# Patient Record
Sex: Male | Born: 1990 | Race: White | Hispanic: No | Marital: Married | Smoking: Never smoker
Health system: Southern US, Community
[De-identification: ages and names within clinical notes are randomized; demographics above are authoritative.]

## PROBLEM LIST (undated history)

## (undated) DIAGNOSIS — K219 Gastro-esophageal reflux disease without esophagitis: Secondary | ICD-10-CM

## (undated) DIAGNOSIS — E669 Obesity, unspecified: Secondary | ICD-10-CM

## (undated) DIAGNOSIS — R1013 Epigastric pain: Secondary | ICD-10-CM

## (undated) DIAGNOSIS — H9190 Unspecified hearing loss, unspecified ear: Secondary | ICD-10-CM

## (undated) DIAGNOSIS — I10 Essential (primary) hypertension: Secondary | ICD-10-CM

## (undated) DIAGNOSIS — F32A Depression, unspecified: Secondary | ICD-10-CM

## (undated) DIAGNOSIS — E049 Nontoxic goiter, unspecified: Secondary | ICD-10-CM

## (undated) DIAGNOSIS — G902 Horner's syndrome: Secondary | ICD-10-CM

## (undated) DIAGNOSIS — Q213 Tetralogy of Fallot: Secondary | ICD-10-CM

## (undated) DIAGNOSIS — Z9109 Other allergy status, other than to drugs and biological substances: Secondary | ICD-10-CM

## (undated) DIAGNOSIS — F419 Anxiety disorder, unspecified: Secondary | ICD-10-CM

## (undated) DIAGNOSIS — Z8774 Personal history of (corrected) congenital malformations of heart and circulatory system: Secondary | ICD-10-CM

## (undated) DIAGNOSIS — E063 Autoimmune thyroiditis: Secondary | ICD-10-CM

## (undated) DIAGNOSIS — J45909 Unspecified asthma, uncomplicated: Secondary | ICD-10-CM

## (undated) DIAGNOSIS — E7849 Other hyperlipidemia: Secondary | ICD-10-CM

## (undated) HISTORY — DX: Obesity, unspecified: E66.9

## (undated) HISTORY — DX: Essential (primary) hypertension: I10

## (undated) HISTORY — PX: CARDIAC SURGERY: SHX584

## (undated) HISTORY — DX: Epigastric pain: R10.13

## (undated) HISTORY — DX: Unspecified hearing loss, unspecified ear: H91.90

## (undated) HISTORY — PX: KNEE SURGERY: SHX244

## (undated) HISTORY — PX: LUMBAR SPINE SURGERY: SHX701

## (undated) HISTORY — DX: Unspecified asthma, uncomplicated: J45.909

## (undated) HISTORY — DX: Nontoxic goiter, unspecified: E04.9

## (undated) HISTORY — DX: Other allergy status, other than to drugs and biological substances: Z91.09

## (undated) HISTORY — PX: OTHER SURGICAL HISTORY: SHX169

## (undated) HISTORY — DX: Autoimmune thyroiditis: E06.3

## (undated) HISTORY — DX: Other hyperlipidemia: E78.49

## (undated) HISTORY — PX: EXTERNAL EAR SURGERY: SHX627

## (undated) HISTORY — DX: Horner's syndrome: G90.2

## (undated) HISTORY — DX: Personal history of (corrected) congenital malformations of heart and circulatory system: Z87.74

## (undated) HISTORY — DX: Gastro-esophageal reflux disease without esophagitis: K21.9

---
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
## 2010-04-20 ENCOUNTER — Encounter: Payer: Self-pay | Admitting: Specialist

## 2010-08-23 ENCOUNTER — Encounter: Payer: Self-pay | Admitting: *Deleted

## 2010-08-23 DIAGNOSIS — E038 Other specified hypothyroidism: Secondary | ICD-10-CM

## 2010-08-23 DIAGNOSIS — I10 Essential (primary) hypertension: Secondary | ICD-10-CM | POA: Insufficient documentation

## 2010-09-13 ENCOUNTER — Other Ambulatory Visit: Payer: Self-pay | Admitting: "Endocrinology

## 2010-09-30 ENCOUNTER — Other Ambulatory Visit: Payer: Self-pay | Admitting: "Endocrinology

## 2010-10-01 ENCOUNTER — Other Ambulatory Visit: Payer: Self-pay | Admitting: *Deleted

## 2010-12-25 ENCOUNTER — Ambulatory Visit: Payer: Self-pay | Admitting: "Endocrinology

## 2011-01-01 ENCOUNTER — Other Ambulatory Visit: Payer: Self-pay | Admitting: "Endocrinology

## 2011-02-11 ENCOUNTER — Ambulatory Visit (INDEPENDENT_AMBULATORY_CARE_PROVIDER_SITE_OTHER): Payer: Self-pay | Admitting: "Endocrinology

## 2011-02-11 ENCOUNTER — Encounter: Payer: Self-pay | Admitting: "Endocrinology

## 2011-02-11 VITALS — BP 154/80 | HR 89 | Wt 191.8 lb

## 2011-02-11 DIAGNOSIS — E669 Obesity, unspecified: Secondary | ICD-10-CM

## 2011-02-11 DIAGNOSIS — E038 Other specified hypothyroidism: Secondary | ICD-10-CM

## 2011-02-11 DIAGNOSIS — E063 Autoimmune thyroiditis: Secondary | ICD-10-CM

## 2011-02-11 DIAGNOSIS — E049 Nontoxic goiter, unspecified: Secondary | ICD-10-CM

## 2011-02-11 DIAGNOSIS — K219 Gastro-esophageal reflux disease without esophagitis: Secondary | ICD-10-CM

## 2011-02-11 DIAGNOSIS — I10 Essential (primary) hypertension: Secondary | ICD-10-CM

## 2011-02-11 DIAGNOSIS — R1013 Epigastric pain: Secondary | ICD-10-CM

## 2011-02-11 LAB — GLUCOSE, POCT (MANUAL RESULT ENTRY): POC Glucose: 97

## 2011-02-11 MED ORDER — LISINOPRIL 10 MG PO TABS: ORAL_TABLET | ORAL | Status: DC

## 2011-02-11 NOTE — Patient Instructions (Signed)
Followup visit with me in 6 months. Please take lisinopril, 10 mg, one pill at breakfast and one pill at supper. Please check her blood pressure checked with Dr. Janelle Floor in about 2 weeks. Please give me a call after that so I know if we need to adjust her blood pressure pills further. Please repeat thyroid tests done about 2 weeks prior to next visit.

## 2011-04-01 ENCOUNTER — Other Ambulatory Visit: Payer: Self-pay | Admitting: "Endocrinology

## 2011-05-19 ENCOUNTER — Encounter: Payer: Self-pay | Admitting: "Endocrinology

## 2011-05-19 DIAGNOSIS — E669 Obesity, unspecified: Secondary | ICD-10-CM | POA: Insufficient documentation

## 2011-05-19 DIAGNOSIS — J45909 Unspecified asthma, uncomplicated: Secondary | ICD-10-CM | POA: Insufficient documentation

## 2011-05-19 DIAGNOSIS — Z8774 Personal history of (corrected) congenital malformations of heart and circulatory system: Secondary | ICD-10-CM | POA: Insufficient documentation

## 2011-05-19 DIAGNOSIS — G902 Horner's syndrome: Secondary | ICD-10-CM | POA: Insufficient documentation

## 2011-05-19 DIAGNOSIS — Z9109 Other allergy status, other than to drugs and biological substances: Secondary | ICD-10-CM | POA: Insufficient documentation

## 2011-05-19 DIAGNOSIS — E7849 Other hyperlipidemia: Secondary | ICD-10-CM | POA: Insufficient documentation

## 2011-05-19 DIAGNOSIS — R1013 Epigastric pain: Secondary | ICD-10-CM | POA: Insufficient documentation

## 2011-05-19 DIAGNOSIS — E049 Nontoxic goiter, unspecified: Secondary | ICD-10-CM | POA: Insufficient documentation

## 2011-05-19 DIAGNOSIS — K219 Gastro-esophageal reflux disease without esophagitis: Secondary | ICD-10-CM | POA: Insufficient documentation

## 2011-05-19 DIAGNOSIS — I10 Essential (primary) hypertension: Secondary | ICD-10-CM | POA: Insufficient documentation

## 2011-05-19 DIAGNOSIS — E063 Autoimmune thyroiditis: Secondary | ICD-10-CM | POA: Insufficient documentation

## 2011-05-19 DIAGNOSIS — H9190 Unspecified hearing loss, unspecified ear: Secondary | ICD-10-CM | POA: Insufficient documentation

## 2011-05-19 NOTE — Progress Notes (Addendum)
Subjective:  Patient Name: Albert Watson Date of Birth: [birth year]  MRN: [id number]  Albert Watson  presents to the office today for follow-up of his [place], and hyperlipidemia, goiter, status post surgery for complex congenital heart disease, Hashimoto's disease, dyspepsia, acquired hypothyroidism, GERD, hypertension, and obesity.  HISTORY OF PRESENT ILLNESS:   Albert Watson is a 21 y.o. Caucasian young man.  Albert Watson was unaccompanied.   1. The patient was first referred to me on 11/13/2005 by his primary care provider, Dr. 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 The patient was almost 21 years old.   A. The patient had been born with Tetralogy of Fallot. He had had 2 surgical procedures to correct his complex congenital heart disease. As part of his pediatric cardiology followup, Dr. Rosiland Oz, [facility] pediatric cardiologist ordered laboratory tests which reportedly showed a higher level of triglycerides than desirable. His past medical history was positive for asthma and allergies to environmental allergens. 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 He had a previous fracture of his left knee. During his second cardiac surgery hr had a collapse of his lung. 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 He also had ear surgery. He was allergic to sulfa, Septra, Augmentin, azithromycin, and codeine. Family history was positive for type 2 diabetes in the biologic mother and maternal grandmother. Mother had increased cholesterol and increased triglycerides. She was also obese. AThere was a strong family history of heart disease. Paternal grandfather had coronary artery bypass graft and several MIs. Paternal grandmother had several coronary artery bypass grafts. Paternal uncle had MIs and coronary artery bypass graft. Paternal grandfather died of an MI. Father had high cholesterol.  B. On physical examination his height was  168.4 cm, roughly 66 inches. His weight was 150 pounds. His blood pressure was 131/70. His hemoglobin A1c was 5.1%. He was an alert and bright young man. He had a 25-30 g thyroid gland. Thyroid gland was diffusely enlarged. He had crescendo-decrescendo heart murmur. The remainder of his examination was unremarkable. Panel from 09/01/05 showed a cholesterol of 152, triglycerides 233, HDL of 29, and LDL of 77. TSH was 2.066. We discussed our Eat Right Diet plan. I referred him to Ms. Hosie Spangle, RD, CDE, a registered dietitian at [facility] in [place]. I asked him to exercise 30-40 minutes every day.  2. During past 5 years, we have dealt with several issues:  A. Familial combined hyperlipidemia: The patient has never been on any lipid medications. Total cholesterol values have varied between 114-164. Triglyceride values have varied between 149-491. HDL values have varied between 21-41. LDL values have varied between 57-94 Most of his higher triglyceride values have occurred when he was hypothyroid.   B. 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 Laboratory tests done on 03/05/06 showed a TSH of 1.335, a T4 of 10.7, and a T3 of 197.2. The TPO antibody was borderline elevated at 37.2. Thyroid tests and 10/26/06, however, showed a TSH of 13.275, free T4 0.96, and free T3 at 3.1. We waited another 2 months and repeated the tests. On 12/04/06 the TSH was 15.443, free T4 was 0.90, and the T3 was 252.1. I started him on Synthroid, 75 mcg per day. During the past 2 years we have gradually increased the Synthroid dose to 125 mcg 6 days per week and 62.5 mcg (half of a 125 mcg tablet) on Sundays.  C. Obesity: He has gained weight,  lost weight, and  gained weight again. His highest weight prior to today was 196.2 pounds at his last visit on 08/06/09.  D. Dyspepsia/GERD: Part of the patient's problem in gaining weight has been dyspepsia, which presents as hunger pains. He also has reflux. When the patient  first came to see me in 2007 he was already on Nexium. In 2008 we converted him to prilosec, 40 mg/day.   E. Hypertension: He's been taking lisinopril since January 2011. 3. The patient's last [facility] visit was on 08/06/09. In the interim, his blood pressure has often been elevated. There have been a lot of stress in the family. His uncle died 3 months ago in a motor vehicle accident. His mother is in liver failure due to her cirrhosis. She's been in the hospital. She is hoping to get a liver transplant. 4. Pertinent Review of Systems:  Constitutional: The patient feels "pretty good "overall. Eyes: Vision is good. There are no significant eye complaints. Neck: He does have an occasional "knot" of his anterior neck in the area of his thyroid isthmus. The patient has no complaints of anterior neck soreness, tenderness,  pressure, discomfort, or difficulty swallowing.  Heart: Heart rate increases with exercise or other physical activity. The patient has no complaints of palpitations, irregular heat beats, chest pain, or chest pressure. Gastrointestinal: He still has some excess hunger and some reflux at times. Bowel movents seem normal. The patient has no complaints of upset stomach, stomach aches or pains, diarrhea, or constipation. Legs: Muscle mass and strength seem normal. He still has problems with pain in his left knee. There are no complaints of numbness, tingling, burning, or other pain. No edema is noted. Feet: There are no obvious foot problems. There are no complaints of numbness, tingling, burning, or pain. No edema is noted.   PAST MEDICAL, FAMILY, AND SOCIAL HISTORY:  Past Medical History  Diagnosis Date  . Status post surgery for complex congenital heart disease   . Familial combined hyperlipidemia   . Goiter   . Thyroiditis, autoimmune   . Dyspepsia   . Hypothyroidism, acquired, autoimmune   . GERD (gastroesophageal reflux disease)   . Hypertension   . Asthma, chronic   .  Environmental allergies   . Horner's syndrome   . Hard of hearing   . Obesity (BMI 30-39.9)     Family History  Problem Relation Age of Onset  . Hyperlipidemia Mother     Increased cholesterol and triglycerides  . Diabetes Mother     T2 DM  . Obesity Mother   . Thyroid disease Father   . Heart disease Maternal Uncle     MI and CABG  . Heart disease Maternal Grandmother     CABG  . Heart disease Maternal Grandfather     CABG and MIs  . Cancer Maternal Grandfather     Skin  . Heart disease Paternal Grandfather     Died of MI    Current outpatient prescriptions:docusate sodium (COLACE) 100 MG capsule, Take 100 mg by mouth 2 (two) times daily.  , Disp: , Rfl: ;  lisinopril (PRINIVIL,ZESTRIL) 10 MG tablet, One 10 mg tablet at breakfast and one 10 mg tablet at supper., Disp: 60 tablet, Rfl: 6;  omeprazole (PRILOSEC) 40 MG capsule, Take 40 mg by mouth daily.  , Disp: , Rfl:  levothyroxine (SYNTHROID, LEVOTHROID) 125 MCG tablet, TAKE ONE TABLET BY MOUTH 6 DAYS A WEEK (MON-SAT) AND ONE-HALF TABLET BY MOUTH ON SUN, Disp: 30 tablet, Rfl: 4  Allergies as  of 02/11/2011 - Review Complete 02/11/2011  Allergen Reaction Noted  . Augmentin  08/23/2010  . Codeine  08/23/2010  . Septra (bactrim)  08/23/2010    1. Work and Family: He still attends [employer]. 2. Activities: He is not exercising at all. 3. Smoking, alcohol, or drugs: No tobacco or drugs. 4. Primary Care Provider: Dr. Joette Catching  ROS: There are no other significant problems involving Cole's other body systems.   Objective:  Vital Signs:  BP 154/80  Pulse 89  Wt 191 lb 12.8 oz (87 kg)   Ht Readings from Last 3 Encounters:  No data found for Ht   His height is 67 inches. Wt Readings from Last 3 Encounters:  02/11/11 191 lb 12.8 oz (87 kg)   His BMI is 35 to  PHYSICAL EXAM:  Constitutional: The patient appears healthy and well nourished.  Face: The face appears normal.  Eyes: There is no  obvious arcus or proptosis. Moisture appears normal. Mouth: The oropharynx and tongue appear normal. Oral moisture is normal. Neck: The neck appears to be visibly normal. No carotid bruits are noted. The thyroid gland is 25-30 g in size. Thyroid gland is relatively firm. The thyroid gland is not tender to palpation. Lungs: The lungs are clear to auscultation. Air movement is good. Heart: Heart rate and rhythm are regular. Heart sounds S1 and S2 are normal. I did not appreciate any pathologic cardiac murmurs. Abdomen: The abdomen is somewhat enlarged. Bowel sounds are normal. There is no obvious hepatomegaly, splenomegaly, or other mass effect.  Arms: Muscle size and bulk are normal for age. Hands: There is no obvious tremor. Phalangeal and metacarpophalangeal joints are normal. Palmar muscles are normal. Palmar skin is normal. Palmar moisture is also normal. Legs: Muscles appear normal for age. No edema is present. Neurologic: Strength is normal for age in both the upper and lower extremities. Muscle tone is normal. Sensation to touch is normal in both legs.    LAB DATA: 02/10/11: TSH was 1.148. Free T4 was 1.48. Free T3 was 4.2.   Assessment and Plan:   ASSESSMENT:  1. Hypothyroidism: The patient was euthyroid on his current doses of thyroid hormone. I suspect that he has more thyroid cells to lose and that we will probably need to increase his Synthroid dose over the next 5 years. 2. Hashimoto's disease: His thyroiditis is clinically quiescent. 3. Hypertension: His blood pressure is clearly worse. He needs more lisinopril. He also needs to take his medicines every day. 4. Obesity: His weight today of 191 pounds is his maximum weight ever. The increase in weight is part of the reason his blood pressure has increased. Obesity exacerbates many other problems, including hyperlipidemia. If non-alcoholic fatty liver disease was a factor in causing his mother's cirrhosis and liver failure, then it is  especially important for him to lose fat weight. 5. Goiter: His thyroid gland is smaller than it was in January 2011. 6. Dyspepsia/GERD: His symptoms are better on omeprazole.  PLAN:  1. Diagnostic: TFTs, CMP, and lipid panel prior to next visit. 2. Therapeutic: Increase lisinoprol to 10 mg, twice daily. Follow-up with Dr. Lysbeth Galas. 3. Patient education: We discussed how Hashimoto's disease has gradually but progressively knocked out his thyroid cells. We also discussed the possibility that he could develop either type 2 diabetes that runs in his family or type 1 diabetes, another autoimmune disease 4. Follow-up: Return in about 6 months (around 08/11/2011).  Level of Service: This visit lasted  in excess of 40 minutes. More than 50% of the visit was devoted to counseling.   David Stall, MD 05/19/2011 6:04 PM

## 2011-08-11 ENCOUNTER — Ambulatory Visit: Payer: Self-pay | Admitting: "Endocrinology

## 2011-08-28 ENCOUNTER — Ambulatory Visit: Payer: Self-pay | Admitting: Pediatrics

## 2011-11-05 ENCOUNTER — Ambulatory Visit: Payer: Self-pay | Admitting: "Endocrinology

## 2012-01-26 ENCOUNTER — Other Ambulatory Visit: Payer: Self-pay | Admitting: "Endocrinology

## 2012-02-05 ENCOUNTER — Other Ambulatory Visit: Payer: Self-pay | Admitting: "Endocrinology

## 2012-02-05 NOTE — Telephone Encounter (Signed)
1. The patient left a VM message that he is running our of his BP medication(lisinopril). His pharmacy told him that they had requested refill authorization from [place], but we had not responded. He asked me to call him at [phone].  2. I attempted to return his call, but he was not available. His last clinic visit with me was on 02/11/11. At that time he was taking 10 mg of lisinopril, twice daily. Since he has not returned to clinic in almost one year, I can only give him a three-month refill authorization. I asked him to call me and give me the telephone number of the pharmacy to which he wants me to call the refill. If he calls and requests a FU appointment within the next three months and then keeps the appointment, then I can continue to prescribe provide endocrine care to him and medication for him. If not, however, then he will be discharged from the practice.  Albert Watson

## 2012-02-06 ENCOUNTER — Telehealth: Payer: Self-pay | Admitting: "Endocrinology

## 2012-02-06 NOTE — Telephone Encounter (Signed)
1. The patient called earlier and left a VM msg requesting refills of his lisinopril, 10 mg tabs, one tab, twice daily. 2. I returned his call and notified him that I had called the scrip in to his [facility] pharmacy in [place], [phone].  3. He has made a FU appointment with me on 05/25/11. He requested that a lab order request be faxed to Dr. Joyce Copa office so that he ncan have his labs drawn there prior to his appointment. I told him that he needs a CMP, TFTs, and fasting lipid panel. Albert Watson

## 2012-02-06 NOTE — Addendum Note (Signed)
Addended by: David Stall on: 02/06/2012 04:48 PM   Modules accepted: Orders

## 2012-04-26 ENCOUNTER — Other Ambulatory Visit: Payer: Self-pay | Admitting: *Deleted

## 2012-04-26 DIAGNOSIS — E038 Other specified hypothyroidism: Secondary | ICD-10-CM

## 2012-05-21 LAB — LIPID PANEL
Cholesterol: 173 mg/dL (ref 0–200)
HDL: 35 mg/dL — ABNORMAL LOW (ref >39)
Total CHOL/HDL Ratio: 4.9 Ratio
Triglycerides: 283 mg/dL — ABNORMAL HIGH (ref <150)

## 2012-05-21 LAB — TSH: TSH: 0.321 u[IU]/mL — ABNORMAL LOW (ref 0.350–4.500)

## 2012-05-21 LAB — COMPREHENSIVE METABOLIC PANEL
AST: 21 U/L (ref 0–37)
Alkaline Phosphatase: 74 U/L (ref 39–117)
BUN: 13 mg/dL (ref 6–23)
Creat: 0.85 mg/dL (ref 0.50–1.35)
Potassium: 4.4 mEq/L (ref 3.5–5.3)
Total Bilirubin: 0.5 mg/dL (ref 0.3–1.2)

## 2012-05-24 ENCOUNTER — Ambulatory Visit (INDEPENDENT_AMBULATORY_CARE_PROVIDER_SITE_OTHER): Payer: Self-pay | Admitting: "Endocrinology

## 2012-05-24 ENCOUNTER — Encounter: Payer: Self-pay | Admitting: "Endocrinology

## 2012-05-24 VITALS — BP 153/88 | HR 105 | Wt 193.6 lb

## 2012-05-24 DIAGNOSIS — R1013 Epigastric pain: Secondary | ICD-10-CM

## 2012-05-24 DIAGNOSIS — E669 Obesity, unspecified: Secondary | ICD-10-CM

## 2012-05-24 LAB — POCT GLYCOSYLATED HEMOGLOBIN (HGB A1C): Hemoglobin A1C: 5

## 2012-05-24 NOTE — Patient Instructions (Signed)
Follow up visit in 6 months. 

## 2012-05-24 NOTE — Progress Notes (Signed)
Subjective:  Patient Name: Albert Watson Date of Birth: 1990/07/21  MRN: [id number]  Albert Watson  presents to the office today for follow-up of his familial combined hyperlipidemia, goiter, status post surgery for complex congenital heart disease, Hashimoto's disease, dyspepsia, acquired autoimmune hypothyroidism, GERD, hypertension, and obesity.  HISTORY OF PRESENT ILLNESS:   Albert Watson is a 22 y.o. Caucasian young man.  Albert Watson was accompanied by his father.   1. The patient was first referred to me on 11/13/2005 by his primary care provider, Dr. Joette Catching, from [place], [place], for evaluation and management of familial combined hyperlipidemia. The patient was almost 22 years old.   A. The patient had been born with Tetralogy of Fallot. He had had 2 surgical procedures to correct his complex congenital heart disease. As part of his pediatric cardiology followup, Dr. Rosiland Oz, [facility] pediatric cardiologist ordered laboratory tests which reportedly showed a higher level of triglycerides than desirable. His past medical history was positive for asthma and allergies to environmental allergens. 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 He had a previous fracture of his left knee. During his second cardiac surgery he had a collapse of his lung. 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 He also had ear surgery. He was allergic to sulfa, Septra, Augmentin, azithromycin, and codeine. Family history was positive for type 2 diabetes in the biologic mother and maternal grandmother. Mother had increased cholesterol and increased triglycerides. She was also obese. There was a strong family history of heart disease. Paternal grandfather had coronary artery bypass graft and several MIs. Paternal grandmother had several coronary artery bypass grafts. Paternal uncle had MIs and coronary artery bypass graft. Paternal grandfather died of an MI. Father had high cholesterol.  B. On physical  examination his height was 168.4 cm, roughly 66 inches. His weight was 150 pounds. His blood pressure was 131/70. His hemoglobin A1c was 5.1%. He was an alert and bright young man. He had a 25-30 g thyroid gland. Thyroid gland was diffusely enlarged. He had crescendo-decrescendo heart murmur. The remainder of his examination was unremarkable. Panel from 09/01/05 showed a cholesterol of 152, triglycerides 233, HDL of 29, and LDL of 77. TSH was 2.066. We discussed our Eat Right Diet plan. I referred him to Ms. Hosie Spangle, RD, CDE, a registered dietitian at [facility] in [place]. I asked him to exercise 30-40 minutes every day.  2. During the past 6 years, we have dealt with several issues:  A. Familial combined hyperlipidemia: The patient has never been on any lipid medications. Total cholesterol values have varied between 114-164. Triglyceride values have varied between 149-491. HDL values have varied between 21-41. LDL values have varied between 57-94 Most of his higher triglyceride values have occurred when he was hypothyroid.   B. 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 Laboratory tests done on 03/05/06 showed a TSH of 1.335, a T4 of 10.7, and a T3 of 197.2. The TPO antibody was borderline elevated at 37.2. Thyroid tests on 10/26/06, however, showed a TSH of 13.275, free T4 0.96, and free T3 at 3.1. We waited another 2 months and repeated the tests. On 12/04/06 the TSH was 15.443, free T4 was 0.90, and the T3 was 252.1. I started him on Synthroid, 75 mcg per day. During the past 2 years we have gradually increased the Synthroid dose to 125 mcg 6 days per week and 62.5 mcg (half of a 125 mcg tablet) on Sundays.  C. Obesity: He has gained  weight, lost weight, and gained weight again. His highest weight was 196.2 pounds at his visit on 08/11/10.  D. Dyspepsia/GERD: Part of the patient's problem in gaining weight has been dyspepsia, which presents as hunger pains. He also has reflux. When the  patient first came to see me in 2007 he was already on Nexium. In 2008 we converted him to prilosec, 40 mg/day.   E. Hypertension: He's been taking lisinopril since January 2011. 3. The patient's last [facility] visit was on 02/11/11. In the interim, his mother passed away from NASH. Her doctor reportedly told the family the NASH was due to her obesity, but may have been aggravated by Lipitor. Dad believes the NASH was due to Lipitor. Although dad understands that she had abnormal LFTs before beginning Lipitor, he still believes that it was the Lipitor that killed his wife. Dad has severe, bilateral hip arthritis. Albert Watson takes Synthroid, 125 mcg 6 days per week and 1/2 tab on Sundays. He takes lisinopril, 10 mg each AM and 5 mg each PM. He also takes omeprazole, 40 mg each AM. He often takes in a lot of caffeine.  4. Pertinent Review of Systems:  Constitutional: The patient feels "good "overall. Eyes: Vision is good. There are no significant eye complaints. Neck: The patient has no complaints of anterior neck soreness, tenderness,  pressure, discomfort, or difficulty swallowing.  Heart: He occasionally has a brief palpitation of his heart. Heart rate increases with exercise or other physical activity. The patient has no complaints of chest pain or chest pressure. Gastrointestinal: He has occasional excess hunger and reflux at times. Bowel movents seem normal. The patient has no complaints of upset stomach, stomach aches or pains, diarrhea, or constipation. Legs: Muscle mass and strength seem normal. He still has problems with pain in his left knee. There are no complaints of numbness, tingling, burning, or other pain. No edema is noted. Feet: There are no obvious foot problems. There are no complaints of numbness, tingling, burning, or pain. No edema is noted.   PAST MEDICAL, FAMILY, AND SOCIAL HISTORY:  Past Medical History  Diagnosis Date  . Status post surgery for complex congenital heart disease   .  Familial combined hyperlipidemia   . Goiter   . Thyroiditis, autoimmune   . Dyspepsia   . Hypothyroidism, acquired, autoimmune   . GERD (gastroesophageal reflux disease)   . Hypertension   . Asthma, chronic   . Environmental allergies   . Horner's syndrome   . Hard of hearing   . Obesity (BMI 30-39.9)     Family History  Problem Relation Age of Onset  . Hyperlipidemia Mother     Increased cholesterol and triglycerides  . Diabetes Mother     T2 DM  . Obesity Mother   . Thyroid disease Father   . Hyperlipidemia Father     High cholesterol  . Heart disease Maternal Uncle     MI and CABG  . Heart disease Maternal Grandmother     CABG  . Hypertension Maternal Grandmother     T2 DM  . Heart disease Maternal Grandfather     CABG and MIs  . Cancer Maternal Grandfather     Skin  . Heart disease Paternal Grandfather     Died of MI    Current outpatient prescriptions:docusate sodium (COLACE) 100 MG capsule, Take 100 mg by mouth 2 (two) times daily.  , Disp: , Rfl: ;  levothyroxine (SYNTHROID, LEVOTHROID) 125 MCG tablet, TAKE ONE TABLET BY MOUTH  6 DAYS A WEEK (MON-SAT) AND ONE-HALF TABLET BY MOUTH ON SUN, Disp: 30 tablet, Rfl: 4;  lisinopril (PRINIVIL,ZESTRIL) 10 MG tablet, One 10 mg tablet at breakfast and one 10 mg tablet at supper., Disp: 60 tablet, Rfl: 6 omeprazole (PRILOSEC) 40 MG capsule, Take 40 mg by mouth daily.  , Disp: , Rfl:   Allergies as of 05/24/2012 - Review Complete 05/24/2012  Allergen Reaction Noted  . Amoxicillin-pot clavulanate  08/23/2010  . Azithromycin  05/19/2011  . Codeine  08/23/2010  . Septra (bactrim)  08/23/2010  . Sulfa antibiotics  05/19/2011    1. Work and Family: He dropped out of [employer] to take care of his mom and earn some money to pay bills. He works at [employer]. He is building more muscle. He plans to re-enter college in th future.  2. Activities: He is not exercising much at all. 3. Smoking, alcohol, or drugs: No  tobacco or drugs. 4. Primary Care Provider: Dr. Joette Catching  REVIEW OF SYSTEMS: There are no other significant problems involving Cole's other body systems.   Objective:  Vital Signs:  BP 153/88  Pulse 105  Wt 193 lb 9.6 oz (87.816 kg) He missed his meds today.   Ht Readings from Last 3 Encounters:  No data found for Ht   His height is 67 inches. Wt Readings from Last 3 Encounters:  05/24/12 193 lb 9.6 oz (87.816 kg)  02/11/11 191 lb 12.8 oz (87 kg)    PHYSICAL EXAM:  Constitutional: The patient appears healthy, but obese.  Face: The face appears normal.  Eyes: There is no obvious arcus or proptosis. Moisture appears normal. Mouth: The oropharynx and tongue appear normal. Oral moisture is normal. Neck: The neck appears to be visibly normal. No carotid bruits are noted. The thyroid gland is 25-30 g in size. Thyroid gland is relatively firm. The thyroid gland is not tender to palpation. Lungs: The lungs are clear to auscultation. Air movement is good. Heart: Heart rate and rhythm are regular. Heart sounds S1 and S2 are normal. I did not appreciate any pathologic cardiac murmurs. Abdomen: The abdomen is somewhat enlarged. Bowel sounds are normal. There is no obvious hepatomegaly, splenomegaly, or other mass effect.  Arms: Muscle size and bulk are normal for age. Hands: There is no obvious tremor. Phalangeal and metacarpophalangeal joints are normal. Palmar muscles are normal. Palmar skin is normal. Palmar moisture is also normal. Legs: Muscles appear normal for age. No edema is present. Neurologic: Strength is normal for age in both the upper and lower extremities. Muscle tone is normal. Sensation to touch is normal in both legs.    LAB DATA: Hemoglobin A1c is 5.0% today.  02/06/12 (actually drawn on 05/21/12?): CMP normal; TSH 0.321, free T4 1.86, free T3 4.3; cholesterol 173, triglycerides 283, HDL 35, LDL 81; HbA1c 5.0 % 02/10/11: TSH was 1.148. Free T4 was 1.48. Free T3 was  4.2.   Assessment and Plan:   ASSESSMENT:  1. Hypothyroidism: The patient was euthyroid in November 2012. If these TFTs were really drawn last Friday, 05/21/12, then the patient is mildly hyperthyroid now. We have been having a problem with [facility] for months in that they can't seem to correctly record and report the date the labs were ordered, the date the labs were drawn, and the date the labs were resulted.  2. Hashimoto's disease: His thyroiditis is clinically quiescent. 3. Hypertension: His blood pressure is worse. He needs to take his medicines every  day. He needs a BP check in a week. If the BPs are still high, he needs an increase in lisinopril. At last visit I had increased his lisinopril to 10 mg, twice daily. But when he saw Dr. Ace Gins in the interim and complained of feeling sluggish, Dr. Ace Gins reduced the dose to 10 mg in the AM and 5 mg in the PM. 4. Obesity: His weight today of 193 pounds is his maximum weight ever. The increase in weight is part of the reason his blood pressure has increased. Obesity exacerbates many other problems, including hyperlipidemia. 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 5. Goiter: His thyroid gland is essentially the same size as at last visit.  6. Dyspepsia/GERD: His symptoms are better on omeprazole. 7. Familial combined hyperlipidemia: His recent triglyceride level is high. His cholesterol and LDL are fine. His HDL is low. Since he does not want to take any statins, he needs to do an hour of exercise a day or more. He also needs to substantially reduce the amount of carbs and fats in his diet.    PLAN:  1. Diagnostic: TFTs, CMP, and lipid panel prior to next visit. 2. Therapeutic: Continue lisinopril at 10 mg and 5 mg. Follow-up with Dr. Lysbeth Galas for BP. Eat Right Diet or [facility] Diet. Walk an hour or more per day.  3.  Patient education: We discussed how Hashimoto's disease has gradually but progressively knocked out his thyroid cells. We also discussed the possibility that he could develop either type 2 diabetes that runs in his family or type 1 diabetes, another autoimmune disease. We also discussed the possibility of him developing NAFLD later in life if he doesn't lose weight.  4. Follow-up: 6 months  Level of Service: This visit lasted in excess of 40 minutes. More than 50% of the visit was devoted to counseling.   David Stall, MD 05/24/2012 1:42 PM

## 2012-07-02 ENCOUNTER — Ambulatory Visit (HOSPITAL_COMMUNITY)
Admission: RE | Admit: 2012-07-02 | Discharge: 2012-07-02 | Disposition: A | Discharge status: Home / Self Care | Payer: Worker's Compensation | Source: Ambulatory Visit | Attending: Family Medicine | Admitting: Family Medicine

## 2012-07-02 ENCOUNTER — Other Ambulatory Visit (HOSPITAL_COMMUNITY): Payer: Self-pay | Admitting: Family Medicine

## 2012-07-02 DIAGNOSIS — M25562 Pain in left knee: Secondary | ICD-10-CM

## 2012-07-02 DIAGNOSIS — M25569 Pain in unspecified knee: Secondary | ICD-10-CM | POA: Insufficient documentation

## 2012-07-02 DIAGNOSIS — T1490XA Injury, unspecified, initial encounter: Secondary | ICD-10-CM

## 2012-07-02 DIAGNOSIS — S99929A Unspecified injury of unspecified foot, initial encounter: Secondary | ICD-10-CM | POA: Insufficient documentation

## 2012-07-02 DIAGNOSIS — X58XXXA Exposure to other specified factors, initial encounter: Secondary | ICD-10-CM | POA: Insufficient documentation

## 2012-07-02 DIAGNOSIS — S8990XA Unspecified injury of unspecified lower leg, initial encounter: Secondary | ICD-10-CM | POA: Insufficient documentation

## 2012-08-24 ENCOUNTER — Ambulatory Visit: Payer: Worker's Compensation | Attending: Specialist | Admitting: Physical Therapy

## 2012-08-24 DIAGNOSIS — R5381 Other malaise: Secondary | ICD-10-CM | POA: Insufficient documentation

## 2012-08-24 DIAGNOSIS — M25569 Pain in unspecified knee: Secondary | ICD-10-CM | POA: Insufficient documentation

## 2012-08-24 DIAGNOSIS — IMO0001 Reserved for inherently not codable concepts without codable children: Secondary | ICD-10-CM | POA: Insufficient documentation

## 2012-08-27 ENCOUNTER — Encounter: Payer: Self-pay | Admitting: Physical Therapy

## 2012-08-30 ENCOUNTER — Ambulatory Visit: Payer: Worker's Compensation | Attending: Specialist | Admitting: Physical Therapy

## 2012-08-30 DIAGNOSIS — IMO0001 Reserved for inherently not codable concepts without codable children: Secondary | ICD-10-CM | POA: Insufficient documentation

## 2012-08-30 DIAGNOSIS — M25569 Pain in unspecified knee: Secondary | ICD-10-CM | POA: Insufficient documentation

## 2012-08-30 DIAGNOSIS — R5381 Other malaise: Secondary | ICD-10-CM | POA: Insufficient documentation

## 2012-09-03 ENCOUNTER — Ambulatory Visit: Payer: Worker's Compensation | Admitting: Physical Therapy

## 2012-09-06 ENCOUNTER — Encounter: Payer: Self-pay | Admitting: Physical Therapy

## 2012-09-08 ENCOUNTER — Ambulatory Visit: Payer: Worker's Compensation | Admitting: Physical Therapy

## 2012-09-13 ENCOUNTER — Ambulatory Visit: Payer: Worker's Compensation | Admitting: Physical Therapy

## 2012-09-20 ENCOUNTER — Ambulatory Visit: Payer: Worker's Compensation | Admitting: Physical Therapy

## 2012-09-30 ENCOUNTER — Encounter: Payer: Self-pay | Admitting: Physical Therapy

## 2012-10-11 ENCOUNTER — Ambulatory Visit: Payer: Worker's Compensation | Attending: Specialist | Admitting: Physical Therapy

## 2012-10-11 DIAGNOSIS — IMO0001 Reserved for inherently not codable concepts without codable children: Secondary | ICD-10-CM | POA: Insufficient documentation

## 2012-10-11 DIAGNOSIS — R5381 Other malaise: Secondary | ICD-10-CM | POA: Insufficient documentation

## 2012-10-11 DIAGNOSIS — M25569 Pain in unspecified knee: Secondary | ICD-10-CM | POA: Insufficient documentation

## 2012-10-13 ENCOUNTER — Ambulatory Visit: Payer: Worker's Compensation | Admitting: Physical Therapy

## 2012-10-18 ENCOUNTER — Ambulatory Visit: Payer: Worker's Compensation | Admitting: Physical Therapy

## 2012-10-20 ENCOUNTER — Encounter: Payer: Self-pay | Admitting: Physical Therapy

## 2012-10-22 ENCOUNTER — Encounter: Payer: Self-pay | Admitting: Physical Therapy

## 2012-10-29 ENCOUNTER — Other Ambulatory Visit: Payer: Self-pay | Admitting: *Deleted

## 2012-10-29 DIAGNOSIS — E669 Obesity, unspecified: Secondary | ICD-10-CM

## 2012-11-22 ENCOUNTER — Ambulatory Visit: Payer: Self-pay | Admitting: "Endocrinology

## 2013-01-05 ENCOUNTER — Other Ambulatory Visit: Payer: Self-pay | Admitting: "Endocrinology

## 2017-04-29 ENCOUNTER — Emergency Department (HOSPITAL_COMMUNITY): Payer: Self-pay

## 2017-04-29 ENCOUNTER — Emergency Department (HOSPITAL_COMMUNITY)
Admission: EM | Admit: 2017-04-29 | Discharge: 2017-04-29 | Disposition: A | Discharge status: Home / Self Care | Payer: Self-pay | Attending: Emergency Medicine | Admitting: Emergency Medicine

## 2017-04-29 ENCOUNTER — Encounter (HOSPITAL_COMMUNITY): Payer: Self-pay

## 2017-04-29 DIAGNOSIS — Z79899 Other long term (current) drug therapy: Secondary | ICD-10-CM | POA: Insufficient documentation

## 2017-04-29 DIAGNOSIS — I10 Essential (primary) hypertension: Secondary | ICD-10-CM | POA: Insufficient documentation

## 2017-04-29 DIAGNOSIS — J45909 Unspecified asthma, uncomplicated: Secondary | ICD-10-CM | POA: Insufficient documentation

## 2017-04-29 DIAGNOSIS — J069 Acute upper respiratory infection, unspecified: Secondary | ICD-10-CM | POA: Insufficient documentation

## 2017-04-29 DIAGNOSIS — F17220 Nicotine dependence, chewing tobacco, uncomplicated: Secondary | ICD-10-CM | POA: Insufficient documentation

## 2017-04-29 DIAGNOSIS — J40 Bronchitis, not specified as acute or chronic: Secondary | ICD-10-CM | POA: Insufficient documentation

## 2017-04-29 MED ORDER — LORATADINE-PSEUDOEPHEDRINE ER 5-120 MG PO TB12: 1.0000 | ORAL_TABLET | Freq: Two times a day (BID) | ORAL | 0 refills | Status: DC

## 2017-04-29 MED ORDER — IPRATROPIUM-ALBUTEROL 0.5-2.5 (3) MG/3ML IN SOLN
3.0000 mL | Freq: Once | RESPIRATORY_TRACT | Status: AC
  Administered 2017-04-29: 3 mL via RESPIRATORY_TRACT
  Filled 2017-04-29: qty 3

## 2017-04-29 MED ORDER — DOXYCYCLINE HYCLATE 100 MG PO CAPS: 100.0000 mg | ORAL_CAPSULE | Freq: Two times a day (BID) | ORAL | 0 refills | Status: DC

## 2017-04-29 MED ORDER — DIPHENHYDRAMINE HCL 12.5 MG/5ML PO ELIX
12.5000 mg | ORAL_SOLUTION | Freq: Once | ORAL | Status: AC
  Administered 2017-04-29: 12.5 mg via ORAL
  Filled 2017-04-29: qty 5

## 2017-04-29 MED ORDER — ALBUTEROL SULFATE HFA 108 (90 BASE) MCG/ACT IN AERS
2.0000 | INHALATION_SPRAY | Freq: Once | RESPIRATORY_TRACT | Status: DC
  Filled 2017-04-29: qty 6.7

## 2017-04-29 MED ORDER — DOXYCYCLINE HYCLATE 100 MG PO TABS
100.0000 mg | ORAL_TABLET | Freq: Once | ORAL | Status: AC
  Administered 2017-04-29: 100 mg via ORAL
  Filled 2017-04-29: qty 1

## 2017-04-29 MED ORDER — DEXAMETHASONE SODIUM PHOSPHATE 10 MG/ML IJ SOLN
10.0000 mg | Freq: Once | INTRAMUSCULAR | Status: AC
  Administered 2017-04-29: 10 mg via INTRAMUSCULAR
  Filled 2017-04-29: qty 1

## 2017-04-29 MED ORDER — HYDROCODONE-ACETAMINOPHEN 5-325 MG PO TABS
2.0000 | ORAL_TABLET | Freq: Once | ORAL | Status: AC
  Administered 2017-04-29: 2 via ORAL
  Filled 2017-04-29: qty 2

## 2017-04-29 NOTE — ED Triage Notes (Signed)
Pt reports cough, congestion, fever, and headache since Sunday.

## 2017-04-29 NOTE — ED Provider Notes (Signed)
[facility] EMERGENCY DEPARTMENT Provider Note   CSN: [id number] Arrival date & time: 04/29/17  1703     History   Chief Complaint Chief Complaint  Patient presents with  . Cough    HPI Albert Watson is a 27 y.o. male.  Patient is a 27 year old male who presents to the emergency department with multiple upper respiratory complaints.  The patient states that his problem started on January 26 or 27.  He has been having problems with cough, congestion, headache, fever, and generally not feeling well.  The patient states no one else at home is sick.  He did not have his flu shot this year.  He is not been out of the country recently.  He is not had vomiting, but has had nausea.  No diarrhea reported.  No unusual rash noted.  Patient states he is tried over-the-counter medications, but states these are not helping and he feels that his condition is getting progressively worse.  He presents now for assistance with this problem, particularly has headache.      Past Medical History:  Diagnosis Date  . Asthma, chronic   . Dyspepsia   . Environmental allergies   . Familial combined hyperlipidemia   . GERD (gastroesophageal reflux disease)   . Goiter   . Hard of hearing   . Horner's syndrome   . Hypertension   . Hypothyroidism, acquired, autoimmune   . Obesity (BMI 30-39.9)   . Status post surgery for complex congenital heart disease   . Thyroiditis, autoimmune     Patient Active Problem List   Diagnosis Date Noted  . Status post surgery for complex congenital heart disease   . Familial combined hyperlipidemia   . Goiter   . Thyroiditis, autoimmune   . Dyspepsia   . Hypothyroidism, acquired, autoimmune   . GERD (gastroesophageal reflux disease)   . Hypertension   . Asthma, chronic   . Environmental allergies   . Horner's syndrome   . Hard of hearing   . Obesity (BMI 30-39.9)   . Essential hypertension, benign 08/23/2010    Past Surgical History:  Procedure  Laterality Date  . CARDIAC SURGERY    . complex congenital heart disease    . EXTERNAL EAR SURGERY    . KNEE SURGERY         Home Medications    Prior to Admission medications   Medication Sig Start Date End Date Taking? Authorizing Provider  docusate sodium (COLACE) 100 MG capsule Take 100 mg by mouth 2 (two) times daily.      [provider]  levothyroxine (SYNTHROID, LEVOTHROID) 125 MCG tablet TAKE ONE TABLET BY MOUTH 6 DAYS A WEEK (MON-SAT) AND ONE-HALF TABLET BY MOUTH ON SUN 04/01/11   David Stall, MD  lisinopril (PRINIVIL,ZESTRIL) 10 MG tablet Take 1 tablet lisinopril 10 mg in the morning and 1/2 tablet 5 mg in the evening. 01/05/13   David Stall, MD  LORazepam (ATIVAN) 0.5 MG tablet Take 0.5 mg by mouth every 8 (eight) hours.    [provider]  omeprazole (PRILOSEC) 40 MG capsule Take 40 mg by mouth daily.      [provider]    Family History Family History  Problem Relation Age of Onset  . Hyperlipidemia Mother        Increased cholesterol and triglycerides  . Diabetes Mother        T2 DM  . Obesity Mother   . Thyroid disease Father   .  Hyperlipidemia Father        High cholesterol  . Heart disease Maternal Uncle        MI and CABG  . Heart disease Maternal Grandmother        CABG  . Hypertension Maternal Grandmother        T2 DM  . Heart disease Maternal Grandfather        CABG and MIs  . Cancer Maternal Grandfather        Skin  . Heart disease Paternal Grandfather        Died of MI    Social History Social History   Tobacco Use  . Smoking status: Never Smoker  . Smokeless tobacco: Current User    Types: Snuff  Substance Use Topics  . Alcohol use: No    Frequency: Never  . Drug use: No     Allergies   Amoxicillin-pot clavulanate; Azithromycin; Codeine; Septra [bactrim]; and Sulfa antibiotics   Review of Systems Review of Systems  Constitutional: Positive for appetite change, chills and fever.  HENT:  Positive for congestion, postnasal drip and sinus pressure.   Respiratory: Positive for cough and shortness of breath.   Musculoskeletal: Positive for myalgias.  Neurological: Positive for headaches.     Physical Exam Updated Vital Signs BP (!) 160/95 (BP Location: Left Arm)   Pulse 95   Temp 98.5 F (36.9 C) (Oral)   Resp 16   Ht 5\' 7"  (1.702 m)   Wt 97.5 kg (215 lb)   SpO2 98%   BMI 33.67 kg/m   Physical Exam  Constitutional: He is oriented to person, place, and time. He appears well-developed and well-nourished.  Non-toxic appearance.  HENT:  Head: Normocephalic.  Right Ear: Tympanic membrane and external ear normal.  Left Ear: Tympanic membrane and external ear normal.  Nasal congestion present.  There is no redness, swelling, or warmth of the mastoid areas.  Eyes: EOM and lids are normal. Pupils are equal, round, and reactive to light.  Neck: Normal range of motion. Neck supple. Carotid bruit is not present.  Cardiovascular: Normal rate, regular rhythm, normal heart sounds, intact distal pulses and normal pulses.  Pulmonary/Chest: No stridor. No respiratory distress. He has wheezes. He has no rales.  Abdominal: Soft. Bowel sounds are normal. There is no tenderness. There is no guarding.  Musculoskeletal: Normal range of motion.  Lymphadenopathy:       Head (right side): No submandibular adenopathy present.       Head (left side): No submandibular adenopathy present.    He has no cervical adenopathy.  Neurological: He is alert and oriented to person, place, and time. He has normal strength. No cranial nerve deficit or sensory deficit.  Skin: Skin is warm and dry. No rash noted.  Psychiatric: He has a normal mood and affect. His speech is normal.  Nursing note and vitals reviewed.    ED Treatments / Results  Labs (all labs ordered are listed, but only abnormal results are displayed) Labs Reviewed - No data to display  EKG  EKG Interpretation None        Radiology Dg Chest 2 View  Result Date: 04/29/2017 CLINICAL DATA:  Cough and fevers EXAM: CHEST  2 VIEW COMPARISON:  09/04/2004 FINDINGS: Cardiac shadow is stable. Postsurgical changes are noted. Lungs are well aerated bilaterally without focal infiltrate or sizable effusion. No acute bony abnormality is seen. IMPRESSION: No active cardiopulmonary disease. Electronically Signed   By: Alcide Clever M.D.   On:  04/29/2017 18:19    Procedures Procedures (including critical care time)  Medications Ordered in ED Medications  albuterol (PROVENTIL HFA;VENTOLIN HFA) 108 (90 Base) MCG/ACT inhaler 2 puff (not administered)  ipratropium-albuterol (DUONEB) 0.5-2.5 (3) MG/3ML nebulizer solution 3 mL (not administered)  dexamethasone (DECADRON) injection 10 mg (not administered)  doxycycline (VIBRA-TABS) tablet 100 mg (not administered)  HYDROcodone-acetaminophen (NORCO/VICODIN) 5-325 MG per tablet 2 tablet (not administered)  diphenhydrAMINE (BENADRYL) 12.5 MG/5ML elixir 12.5 mg (not administered)  ipratropium-albuterol (DUONEB) 0.5-2.5 (3) MG/3ML nebulizer solution 3 mL (3 mLs Nebulization Given 04/29/17 1816)     Initial Impression / Assessment and Plan / ED Course  I have reviewed the triage vital signs and the nursing notes.  Pertinent labs & imaging results that were available during my care of the patient were reviewed by me and considered in my medical decision making (see chart for details).       Final Clinical Impressions(s) / ED Diagnoses MDM Vital signs within normal limits, with the exception of the blood pressure being elevated at 160/95.  Pulse oximetry is 95% on room air.  Within normal limits by my interpretation.  The chest x-ray shows some bronchitic changes, but no evidence of pneumonia or other acute problems.  I suspect the patient has a bronchitis and upper respiratory infection.  The patient will be treated with doxycycline, albuterol and Claritin-D.  I discussed with  the patient the importance of good handwashing.  I will provide the patient with a mask.  Patient is to follow-up with Gerrit HeckSamantha Miller in the office, or return to the emergency department if any changes, problems, or concerns.   Final diagnoses:  Bronchitis  Upper respiratory tract infection, unspecified type    ED Discharge Orders    None       Ivery QualeBryant, Paulett Kaufhold, PA-C 04/29/17 2024    Samuel JesterMcManus, Kathleen, DO 04/30/17 2334

## 2017-04-29 NOTE — Discharge Instructions (Signed)
Please increase fluids.  Please wash hands frequently.  Usual mask until symptoms have resolved.  Use albuterol 2 puffs every 4 hours, use doxycycline 1 tablet 2 times daily with food.  Use Claritin-D every 12 hours for congestion and cough.  Use Tylenol every 4 hours or ibuprofen every 6 hours for assistance with your fever, and/or aching.  Please see Dr. Gerrit HeckSamantha Miller for additional follow-up if not improving.

## 2017-07-24 ENCOUNTER — Emergency Department (HOSPITAL_COMMUNITY)
Admission: EM | Admit: 2017-07-24 | Discharge: 2017-07-25 | Disposition: A | Discharge status: Home / Self Care | Payer: Self-pay | Attending: Emergency Medicine | Admitting: Emergency Medicine

## 2017-07-24 ENCOUNTER — Other Ambulatory Visit: Payer: Self-pay

## 2017-07-24 ENCOUNTER — Emergency Department (HOSPITAL_COMMUNITY): Payer: Self-pay

## 2017-07-24 ENCOUNTER — Encounter (HOSPITAL_COMMUNITY): Payer: Self-pay | Admitting: *Deleted

## 2017-07-24 DIAGNOSIS — B9789 Other viral agents as the cause of diseases classified elsewhere: Secondary | ICD-10-CM

## 2017-07-24 DIAGNOSIS — E039 Hypothyroidism, unspecified: Secondary | ICD-10-CM | POA: Insufficient documentation

## 2017-07-24 DIAGNOSIS — Z79899 Other long term (current) drug therapy: Secondary | ICD-10-CM | POA: Insufficient documentation

## 2017-07-24 DIAGNOSIS — I10 Essential (primary) hypertension: Secondary | ICD-10-CM | POA: Insufficient documentation

## 2017-07-24 DIAGNOSIS — J020 Streptococcal pharyngitis: Secondary | ICD-10-CM | POA: Insufficient documentation

## 2017-07-24 DIAGNOSIS — J069 Acute upper respiratory infection, unspecified: Secondary | ICD-10-CM | POA: Insufficient documentation

## 2017-07-24 DIAGNOSIS — J45909 Unspecified asthma, uncomplicated: Secondary | ICD-10-CM | POA: Insufficient documentation

## 2017-07-24 LAB — INFLUENZA PANEL BY PCR (TYPE A & B)
Influenza A By PCR: NEGATIVE
Influenza B By PCR: NEGATIVE

## 2017-07-24 LAB — GROUP A STREP BY PCR: GROUP A STREP BY PCR: DETECTED — AB

## 2017-07-24 MED ORDER — ONDANSETRON 4 MG PO TBDP
4.0000 mg | ORAL_TABLET | Freq: Once | ORAL | Status: AC | PRN
  Administered 2017-07-24: 4 mg via ORAL

## 2017-07-24 MED ORDER — ONDANSETRON 4 MG PO TBDP
ORAL_TABLET | ORAL | Status: AC
  Filled 2017-07-24: qty 1

## 2017-07-24 NOTE — ED Triage Notes (Signed)
Pt c/o cough, congestion, nasal congestion, sore throat,  cough is productive with green/yellow sputum, denies any fever, chills, admits to nausea. Pt states that the symptoms started 3-4 days ago, wife diagnosed with strep two days ago,

## 2017-07-25 MED ORDER — IBUPROFEN 800 MG PO TABS
800.0000 mg | ORAL_TABLET | Freq: Once | ORAL | Status: AC
  Administered 2017-07-25: 800 mg via ORAL
  Filled 2017-07-25: qty 1

## 2017-07-25 MED ORDER — LISINOPRIL 10 MG PO TABS
10.0000 mg | ORAL_TABLET | Freq: Once | ORAL | Status: AC
  Administered 2017-07-25: 10 mg via ORAL
  Filled 2017-07-25: qty 1

## 2017-07-25 MED ORDER — AMOXICILLIN 250 MG PO CAPS
500.0000 mg | ORAL_CAPSULE | Freq: Once | ORAL | Status: AC
  Administered 2017-07-25: 500 mg via ORAL
  Filled 2017-07-25: qty 2

## 2017-07-25 MED ORDER — SALINE SPRAY 0.65 % NA SOLN: 1.0000 | NASAL | 0 refills | Status: DC | PRN

## 2017-07-25 MED ORDER — GUAIFENESIN 100 MG/5ML PO SYRP: 100.0000 mg | ORAL_SOLUTION | ORAL | 0 refills | Status: DC | PRN

## 2017-07-25 MED ORDER — CETIRIZINE HCL 10 MG PO TABS: 10.0000 mg | ORAL_TABLET | Freq: Every day | ORAL | 0 refills | Status: DC

## 2017-07-25 MED ORDER — ONDANSETRON 4 MG PO TBDP: 4.0000 mg | ORAL_TABLET | Freq: Three times a day (TID) | ORAL | 0 refills | Status: DC | PRN

## 2017-07-25 MED ORDER — AMOXICILLIN 500 MG PO CAPS: 500.0000 mg | ORAL_CAPSULE | Freq: Two times a day (BID) | ORAL | 0 refills | Status: AC

## 2017-07-25 NOTE — ED Provider Notes (Signed)
[facility] EMERGENCY DEPARTMENT Provider Note   CSN: [id number] Arrival date & time: 07/24/17  2036     History   Chief Complaint Chief Complaint  Patient presents with  . URI    HPI Albert Watson is a 27 y.o. male with past medical history significant for asthma, environmental allergies, GERD, hypothyroidism, hypertension presenting with 3-4 days of productive cough worsening, sore throat for the last 2 days. Now pressure headache, post-tussive emesis and some nausea here in the ER which has resolved.  He has tried benadryl and tylenol occasionally with some relief at night time. He reports a pressure sinus headache now after post tussive emesis today.  He has not eaten today and  forgot to take his blood pressure medications today. Known ill contacts with wife with positive strep.  HPI  Past Medical History:  Diagnosis Date  . Asthma, chronic   . Dyspepsia   . Environmental allergies   . Familial combined hyperlipidemia   . GERD (gastroesophageal reflux disease)   . Goiter   . Hard of hearing   . Horner's syndrome   . Hypertension   . Hypothyroidism, acquired, autoimmune   . Obesity (BMI 30-39.9)   . Status post surgery for complex congenital heart disease   . Thyroiditis, autoimmune     Patient Active Problem List   Diagnosis Date Noted  . Status post surgery for complex congenital heart disease   . Familial combined hyperlipidemia   . Goiter   . Thyroiditis, autoimmune   . Dyspepsia   . Hypothyroidism, acquired, autoimmune   . GERD (gastroesophageal reflux disease)   . Hypertension   . Asthma, chronic   . Environmental allergies   . Horner's syndrome   . Hard of hearing   . Obesity (BMI 30-39.9)   . Essential hypertension, benign 08/23/2010    Past Surgical History:  Procedure Laterality Date  . CARDIAC SURGERY    . complex congenital heart disease    . EXTERNAL EAR SURGERY    . KNEE SURGERY          Home Medications    Prior to Admission  medications   Medication Sig Start Date End Date Taking? Authorizing Provider  ELDERBERRY PO Take 1 capsule by mouth daily.   Yes [provider]  levothyroxine (SYNTHROID, LEVOTHROID) 125 MCG tablet TAKE ONE TABLET BY MOUTH 6 DAYS A WEEK (MON-SAT) AND ONE-HALF TABLET BY MOUTH ON SUN 04/01/11  Yes David Stall, MD  lisinopril (PRINIVIL,ZESTRIL) 10 MG tablet Take 1 tablet lisinopril 10 mg in the morning and 1/2 tablet 5 mg in the evening. 01/05/13  Yes David Stall, MD  loratadine-pseudoephedrine (CLARITIN-D 12 HOUR) 5-120 MG tablet Take 1 tablet by mouth 2 (two) times daily. 04/29/17  Yes Ivery Quale, PA-C  LORazepam (ATIVAN) 0.5 MG tablet Take 0.5 mg by mouth every 8 (eight) hours.   Yes [provider]  ranitidine (ZANTAC) 150 MG tablet Take 150 mg by mouth 2 (two) times daily as needed for heartburn.   Yes [provider]  amoxicillin (AMOXIL) 500 MG capsule Take 1 capsule (500 mg total) by mouth 2 (two) times daily for 10 days. 07/25/17 08/04/17  Georgiana Shore, PA-C  cetirizine (ZYRTEC ALLERGY) 10 MG tablet Take 1 tablet (10 mg total) by mouth daily. 07/25/17   Georgiana Shore, PA-C  doxycycline (VIBRAMYCIN) 100 MG capsule Take 1 capsule (100 mg total) by mouth 2 (two) times daily. Patient not taking: Reported on 07/24/2017 04/29/17  Ivery Quale, PA-C  guaifenesin (ROBITUSSIN) 100 MG/5ML syrup Take 5-10 mLs (100-200 mg total) by mouth every 4 (four) hours as needed for cough. 07/25/17   Mathews Robinsons B, PA-C  ondansetron (ZOFRAN ODT) 4 MG disintegrating tablet Take 1 tablet (4 mg total) by mouth every 8 (eight) hours as needed for nausea or vomiting. 07/25/17   Mathews Robinsons B, PA-C  sodium chloride (OCEAN) 0.65 % SOLN nasal spray Place 1 spray into both nostrils as needed for congestion. 07/25/17   Georgiana Shore, PA-C    Family History Family History  Problem Relation Age of Onset  . Hyperlipidemia Mother        Increased cholesterol  and triglycerides  . Diabetes Mother        T2 DM  . Obesity Mother   . Thyroid disease Father   . Hyperlipidemia Father        High cholesterol  . Heart disease Maternal Uncle        MI and CABG  . Heart disease Maternal Grandmother        CABG  . Hypertension Maternal Grandmother        T2 DM  . Heart disease Maternal Grandfather        CABG and MIs  . Cancer Maternal Grandfather        Skin  . Heart disease Paternal Grandfather        Died of MI    Social History Social History   Tobacco Use  . Smoking status: Never Smoker  . Smokeless tobacco: Current User    Types: Snuff  Substance Use Topics  . Alcohol use: No    Frequency: Never  . Drug use: No     Allergies   Amoxicillin-pot clavulanate; Azithromycin; Codeine; Septra [bactrim]; and Sulfa antibiotics   Review of Systems Review of Systems  Constitutional: Negative for chills, diaphoresis and fever.  HENT: Positive for congestion, sinus pressure, sneezing and sore throat. Negative for drooling, ear discharge, ear pain, tinnitus, trouble swallowing and voice change.   Eyes: Negative for photophobia, pain, discharge and visual disturbance.  Respiratory: Positive for cough. Negative for choking, chest tightness, shortness of breath, wheezing and stridor.   Cardiovascular: Negative for chest pain and palpitations.  Gastrointestinal: Positive for nausea and vomiting. Negative for abdominal pain and diarrhea.  Genitourinary: Negative for decreased urine volume, difficulty urinating and dysuria.  Musculoskeletal: Negative for myalgias, neck pain and neck stiffness.  Skin: Negative for color change, pallor and rash.  Neurological: Positive for headaches. Negative for dizziness, tremors, syncope, facial asymmetry, speech difficulty, weakness, light-headedness and numbness.     Physical Exam Updated Vital Signs BP (!) 152/93   Pulse 86   Temp 98.4 F (36.9 C) (Oral)   Resp 17   Ht  (1.702 m)   Wt 97.5 kg  (215 lb)   SpO2 97%   BMI 33.67 kg/m   Physical Exam  Constitutional: He is oriented to person, place, and time. He appears well-developed and well-nourished. No distress.  Afebrile, non-toxic appearing, lying comfortably in bed in no acute distress.  HENT:  Head: Normocephalic and atraumatic.  Mouth/Throat: No oropharyngeal exudate.  Oropharynx is erythematous, without edema, exudate or evidence of peritonsillar abscess.  Tolerating oral secretions.  Eyes: Pupils are equal, round, and reactive to light. Conjunctivae and EOM are normal. Right eye exhibits no discharge. Left eye exhibits no discharge.  Neck: Normal range of motion. Neck supple.  Cardiovascular: Normal rate, regular rhythm, normal heart  sounds and intact distal pulses.  No murmur heard. Pulmonary/Chest: Effort normal and breath sounds normal. No stridor. No respiratory distress. He has no wheezes. He has no rales.  Abdominal: He exhibits no distension.  Musculoskeletal: Normal range of motion. He exhibits no edema.  Lymphadenopathy:    He has cervical adenopathy.  Neurological: He is alert and oriented to person, place, and time. No cranial nerve deficit or sensory deficit. He exhibits normal muscle tone.  Normal stance and gait, 5/5 strength in upper and lower extremities bilaterally. Normal balance  Skin: Skin is warm and dry. No rash noted. He is not diaphoretic. No erythema. No pallor.  Psychiatric: He has a normal mood and affect.  Nursing note and vitals reviewed.    ED Treatments / Results  Labs (all labs ordered are listed, but only abnormal results are displayed) Labs Reviewed  GROUP A STREP BY PCR - Abnormal; Notable for the following components:      Result Value   Group A Strep by PCR DETECTED (*)    All other components within normal limits  INFLUENZA PANEL BY PCR (TYPE A & B)    EKG None  Radiology Dg Chest 2 View  Result Date: 07/24/2017 CLINICAL DATA:  Productive cough with congestion EXAM:  CHEST - 2 VIEW COMPARISON:  04/29/2017 FINDINGS: Post sternotomy changes. No focal pulmonary infiltrate or effusion. Stable cardiomediastinal silhouette with right-sided aortic arch. IMPRESSION: 1. No focal pulmonary infiltrate. 2. Stable borderline enlarged cardiomediastinal silhouette with right-sided aortic arch. Electronically Signed   By: Jasmine Pang M.D.   On: 07/24/2017 21:10    Procedures Procedures (including critical care time)  Medications Ordered in ED Medications  ondansetron (ZOFRAN-ODT) disintegrating tablet 4 mg (4 mg Oral Given 07/24/17 2047)  ibuprofen (ADVIL,MOTRIN) tablet 800 mg (800 mg Oral Given 07/25/17 0030)  lisinopril (PRINIVIL,ZESTRIL) tablet 10 mg (10 mg Oral Given 07/25/17 0030)  amoxicillin (AMOXIL) capsule 500 mg (500 mg Oral Given 07/25/17 0030)     Initial Impression / Assessment and Plan / ED Course  I have reviewed the triage vital signs and the nursing notes.  Pertinent labs & imaging results that were available during my care of the patient were reviewed by me and considered in my medical decision making (see chart for details).    Pt afebrile without tonsillar exudate, with mild cervical lymphadenopathy, & odynophagia; diagnosis of strep. Treated in the Ed with NSAIDs and amoxicillin.  Pt does not appear dehydrated, but discussed importance of water rehydration.   Presentation non concerning for PTA or infxn spread to soft tissue. No trismus or uvula deviation. Pt able to drink water in ED without difficulty with intact airway. Successful PO challenge. Recommended PCP follow up.  No vomiting in the emergency department.  Patient with pressure headache which just started today after not eating and not taking his blood pressure medications after posttussive emesis.  Patient was given ibuprofen in the emergency department and reported improvement.  Normal neuro.  Discharge home with symptomatic relief, antibiotics and close follow-up with PCP. Discussed  strict return precautions and advised to return to the emergency department if experiencing any new or worsening symptoms. Instructions were understood and patient agreed with discharge plan.  Final Clinical Impressions(s) / ED Diagnoses   Final diagnoses:  Strep pharyngitis  Viral URI with cough    ED Discharge Orders        Ordered    amoxicillin (AMOXIL) 500 MG capsule  2 times daily  07/25/17 0030    sodium chloride (OCEAN) 0.65 % SOLN nasal spray  As needed     07/25/17 0131    ondansetron (ZOFRAN ODT) 4 MG disintegrating tablet  Every 8 hours PRN     07/25/17 0131    guaifenesin (ROBITUSSIN) 100 MG/5ML syrup  Every 4 hours PRN     07/25/17 0131    cetirizine (ZYRTEC ALLERGY) 10 MG tablet  Daily     07/25/17 0131       Georgiana Shore, PA-C 07/25/17 0220    Zadie Rhine, MD 07/25/17 [phone]

## 2017-07-25 NOTE — Discharge Instructions (Addendum)
As discussed, take your entire course of antibiotics even if you feel better.  Use tea with honey, cough drops, throat sprays over-the-counter as needed to help soothe your throat.  Alternate ibuprofen and Tylenol as needed for pain and fever.  Nasal spray daily and cetirizine daily to help with allergy symptoms. Make sure that you stay well-hydrated drinking enough fluids to keep your urine clear.  Follow-up with your primary care provider. Return sooner if symptoms worsen, difficulty swallowing, difficulty breathing, drooling, worsening pain, persistent fever despite the use of ibuprofen and Tylenol or any other new concerning symptoms in the meantime.

## 2017-07-25 NOTE — ED Notes (Signed)
Pt tolerated fluid challenge well. 

## 2019-04-19 ENCOUNTER — Ambulatory Visit: Payer: Self-pay | Attending: Internal Medicine

## 2019-04-19 ENCOUNTER — Other Ambulatory Visit: Payer: Self-pay

## 2019-04-19 DIAGNOSIS — Z20822 Contact with and (suspected) exposure to covid-19: Secondary | ICD-10-CM | POA: Insufficient documentation

## 2019-04-20 ENCOUNTER — Other Ambulatory Visit: Payer: Self-pay

## 2019-04-20 LAB — NOVEL CORONAVIRUS, NAA: SARS-CoV-2, NAA: NOT DETECTED

## 2020-03-25 ENCOUNTER — Other Ambulatory Visit: Payer: Self-pay

## 2020-03-25 ENCOUNTER — Encounter (HOSPITAL_COMMUNITY): Payer: Self-pay | Admitting: Emergency Medicine

## 2020-03-25 ENCOUNTER — Emergency Department (HOSPITAL_COMMUNITY)
Admission: EM | Admit: 2020-03-25 | Discharge: 2020-03-25 | Disposition: A | Discharge status: Home / Self Care | Payer: Worker's Compensation | Attending: Emergency Medicine | Admitting: Emergency Medicine

## 2020-03-25 DIAGNOSIS — J45909 Unspecified asthma, uncomplicated: Secondary | ICD-10-CM | POA: Diagnosis not present

## 2020-03-25 DIAGNOSIS — R1110 Vomiting, unspecified: Secondary | ICD-10-CM | POA: Diagnosis not present

## 2020-03-25 DIAGNOSIS — M5416 Radiculopathy, lumbar region: Secondary | ICD-10-CM | POA: Insufficient documentation

## 2020-03-25 DIAGNOSIS — Z79899 Other long term (current) drug therapy: Secondary | ICD-10-CM | POA: Insufficient documentation

## 2020-03-25 DIAGNOSIS — I10 Essential (primary) hypertension: Secondary | ICD-10-CM | POA: Diagnosis not present

## 2020-03-25 DIAGNOSIS — M5410 Radiculopathy, site unspecified: Secondary | ICD-10-CM

## 2020-03-25 DIAGNOSIS — E039 Hypothyroidism, unspecified: Secondary | ICD-10-CM | POA: Diagnosis not present

## 2020-03-25 DIAGNOSIS — M545 Low back pain, unspecified: Secondary | ICD-10-CM | POA: Diagnosis present

## 2020-03-25 MED ORDER — MORPHINE SULFATE (PF) 4 MG/ML IV SOLN
6.0000 mg | Freq: Once | INTRAVENOUS | Status: AC
  Administered 2020-03-25: 14:00:00 6 mg via INTRAMUSCULAR
  Filled 2020-03-25: qty 2

## 2020-03-25 MED ORDER — DEXAMETHASONE SODIUM PHOSPHATE 10 MG/ML IJ SOLN
10.0000 mg | Freq: Once | INTRAMUSCULAR | Status: AC
  Administered 2020-03-25: 14:00:00 10 mg via INTRAMUSCULAR
  Filled 2020-03-25: qty 1

## 2020-03-25 MED ORDER — HYDROCODONE-ACETAMINOPHEN 5-325 MG PO TABS: 2.0000 | ORAL_TABLET | ORAL | 0 refills | Status: DC | PRN

## 2020-03-25 MED ORDER — HYDROCODONE-ACETAMINOPHEN 5-325 MG PO TABS
1.0000 | ORAL_TABLET | Freq: Once | ORAL | Status: AC
  Administered 2020-03-25: 16:00:00 1 via ORAL
  Filled 2020-03-25: qty 1

## 2020-03-25 NOTE — ED Triage Notes (Signed)
Patient reports lower back pain and R leg tingling/numbness x2 months. Patient reports the symptoms started after a work-related injury 2 months ago. Patient states symptoms have worsened since then.

## 2020-03-25 NOTE — Discharge Instructions (Addendum)
Call your orthopedist tomorrow for close follow-up.  Return here as needed if you have any worsening symptoms.

## 2020-03-25 NOTE — ED Provider Notes (Signed)
[facility]-EMERGENCY DEPT Provider Note   CSN: [id number] Arrival date & time: 03/25/20  1058     History Chief Complaint  Patient presents with  . Back Pain    Albert Watson is a 29 y.o. male.   Patient is a 29 year old male who has a history of recent back injury.  He had a injury at work in October.  He has been having some pain in his low back that radiates down his right leg.  He has been followed by Dr. Shon Baton with [facility].  His pain has been worsening over the last 2 months with intermittent numbness in his right leg and now over the last few days he feels like his leg is weaker.  He denies any loss of bowel or bladder control although he says that recently he has felt like he does not have the urge to urinate and has to remind himself to urinate.  He can get his urine output does not have the same sensation that he needs to urinate.  He said this seems to be intermittent.  He denies any fevers.  No other recent injuries.  His pain is mostly in his lower back but he does have some pain in his mid back.  He had an MRI on December 21.  The results are below.  He said over the Christmas holidays, he was sitting on a sofa at a family member's house and his pain got worse and he had almost a near syncopal episode.  He had 1 episode of vomiting.  His symptoms have been worse since that time which is why he came in today.  He does not have a follow-up appointment with Dr. Shon Baton until January 3.  He last took a Medrol Dosepak at the end of November.  He is currently on gabapentin but is not controlling his pain.   MR 12/21 1. Changes related to prior L4-L5 microdiscectomy.  2. Hypointense material appearing to extend from and in continuity with the L4-L5 disc, extending into the right subarticular zone with associated right lateral recess narrowing and abutment upon the descending right L5 nerve root. Findings are favored to represent a disc protrusion given  continuous appearance with the disc, less likely scar.  3. 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  4. Partially imaged disc extrusion at T11-T12 with ventral cord flattening and mild canal stenosis. No definite abnormal cord signal. Consider dedicated MR of the thoracic spine for further evaluation.         Past Medical History:  Diagnosis Date  . Asthma, chronic   . Dyspepsia   . Environmental allergies   . Familial combined hyperlipidemia   . GERD (gastroesophageal reflux disease)   . Goiter   . Hard of hearing   . Horner's syndrome   . Hypertension   . Hypothyroidism, acquired, autoimmune   . Obesity (BMI 30-39.9)   . Status post surgery for complex congenital heart disease   . Thyroiditis, autoimmune     Patient Active Problem List   Diagnosis Date Noted  . Status post surgery for complex congenital heart disease   . Familial combined hyperlipidemia   . Goiter   . Thyroiditis, autoimmune   . Dyspepsia   . Hypothyroidism, acquired, autoimmune   . GERD (gastroesophageal reflux disease)   . Hypertension   . Asthma, chronic   . Environmental allergies   . Horner's syndrome   . Hard of hearing   . Obesity (BMI 30-39.9)   .  Essential hypertension, benign 08/23/2010    Past Surgical History:  Procedure Laterality Date  . CARDIAC SURGERY    . complex congenital heart disease    . EXTERNAL EAR SURGERY    . KNEE SURGERY         Family History  Problem Relation Age of Onset  . Hyperlipidemia Mother        Increased cholesterol and triglycerides  . Diabetes Mother        T2 DM  . Obesity Mother   . Thyroid disease Father   . Hyperlipidemia Father        High cholesterol  . Heart disease Maternal Uncle        MI and CABG  . Heart disease Maternal Grandmother        CABG  . Hypertension Maternal Grandmother        T2 DM  . Heart disease Maternal Grandfather        CABG and MIs  . Cancer Maternal Grandfather        Skin  . Heart disease Paternal  Grandfather        Died of MI    Social History   Tobacco Use  . Smoking status: Never Smoker  . Smokeless tobacco: Current User    Types: Snuff  Substance Use Topics  . Alcohol use: No  . Drug use: No    Home Medications Prior to Admission medications   Medication Sig Start Date End Date Taking? Authorizing Provider  cetirizine (ZYRTEC ALLERGY) 10 MG tablet Take 1 tablet (10 mg total) by mouth daily. 07/25/17   Georgiana Shore, PA-C  doxycycline (VIBRAMYCIN) 100 MG capsule Take 1 capsule (100 mg total) by mouth 2 (two) times daily. Patient not taking: Reported on 07/24/2017 04/29/17   Ivery Quale, PA-C  ELDERBERRY PO Take 1 capsule by mouth daily.    [provider]  guaifenesin (ROBITUSSIN) 100 MG/5ML syrup Take 5-10 mLs (100-200 mg total) by mouth every 4 (four) hours as needed for cough. 07/25/17   Georgiana Shore, PA-C  HYDROcodone-acetaminophen (NORCO/VICODIN) 5-325 MG tablet Take 2 tablets by mouth every 4 (four) hours as needed. 03/25/20   Rolan Bucco, MD  levothyroxine (SYNTHROID, LEVOTHROID) 125 MCG tablet TAKE ONE TABLET BY MOUTH 6 DAYS A WEEK (MON-SAT) AND ONE-HALF TABLET BY MOUTH ON SUN 04/01/11   David Stall, MD  lisinopril (PRINIVIL,ZESTRIL) 10 MG tablet Take 1 tablet lisinopril 10 mg in the morning and 1/2 tablet 5 mg in the evening. 01/05/13   David Stall, MD  loratadine-pseudoephedrine (CLARITIN-D 12 HOUR) 5-120 MG tablet Take 1 tablet by mouth 2 (two) times daily. 04/29/17   Ivery Quale, PA-C  LORazepam (ATIVAN) 0.5 MG tablet Take 0.5 mg by mouth every 8 (eight) hours.    [provider]  ondansetron (ZOFRAN ODT) 4 MG disintegrating tablet Take 1 tablet (4 mg total) by mouth every 8 (eight) hours as needed for nausea or vomiting. 07/25/17   Mathews Robinsons B, PA-C  ranitidine (ZANTAC) 150 MG tablet Take 150 mg by mouth 2 (two) times daily as needed for heartburn.    [provider]  sodium chloride (OCEAN) 0.65 % SOLN  nasal spray Place 1 spray into both nostrils as needed for congestion. 07/25/17   Mathews Robinsons B, PA-C    Allergies    Amoxicillin-pot clavulanate, Azithromycin, Codeine, Septra [bactrim], and Sulfa antibiotics  Review of Systems   Review of Systems  Constitutional: Negative for fever.  Gastrointestinal: Negative  for nausea and vomiting.  Genitourinary: Positive for dysuria.  Musculoskeletal: Positive for back pain. Negative for arthralgias, joint swelling and neck pain.  Skin: Negative for wound.  Neurological: Positive for weakness and numbness. Negative for headaches.    Physical Exam Updated Vital Signs BP (!) 159/109   Pulse (!) 103   Temp 98.1 F (36.7 C) (Oral)   Resp 20   SpO2 99%   Physical Exam Constitutional:      Appearance: He is well-developed and well-nourished.  HENT:     Head: Normocephalic and atraumatic.  Cardiovascular:     Rate and Rhythm: Normal rate.     Pulses: Normal pulses.     Heart sounds: No murmur heard.   Pulmonary:     Effort: Pulmonary effort is normal.     Breath sounds: Normal breath sounds.  Abdominal:     General: Abdomen is flat.     Tenderness: There is no abdominal tenderness.  Musculoskeletal:        General: Tenderness and edema present.     Cervical back: Normal range of motion and neck supple.     Comments: Positive tenderness to his lower lumbar spine and around the right sciatic nerve.  Positive straight leg raise on the right.  Patellar reflexes symmetric bilaterally.  Pedal pulses are intact.  He has some diminished sensation to light touch in the right lower leg as compared to the left.  He has maybe some mild weakness in the right as compared to left.  He is able to ambulate but favors the right leg slightly.  He has no foot drop or dragging of his foot.  He also has some tenderness on palpation of his lower thoracic spine, no step-offs or deformities are noted  Skin:    General: Skin is warm and dry.  Neurological:      Mental Status: He is alert and oriented to person, place, and time.  Psychiatric:        Mood and Affect: Mood and affect normal.     ED Results / Procedures / Treatments   Labs (all labs ordered are listed, but only abnormal results are displayed) Labs Reviewed - No data to display  EKG None  Radiology No results found.  Procedures Procedures (including critical care time)  Medications Ordered in ED Medications  HYDROcodone-acetaminophen (NORCO/VICODIN) 5-325 MG per tablet 1 tablet (has no administration in time range)  morphine 4 MG/ML injection 6 mg (6 mg Intramuscular Given 03/25/20 1428)  dexamethasone (DECADRON) injection 10 mg (10 mg Intramuscular Given 03/25/20 1428)    ED Course  I have reviewed the triage vital signs and the nursing notes.  Pertinent labs & imaging results that were available during my care of the patient were reviewed by me and considered in my medical decision making (see chart for details).    MDM Rules/Calculators/A&P                          Patient is a 29 year old male who presents with worsening radicular low back pain.  He now has some mild weakness in his leg and has some decreased sensation when he has to urinate.  He does not have any urinary retention or loss of control of his bowels or bladder.  No fevers.  He had a recent MRI.  I initially spoke with the on-call orthopedic surgeon he was not very familiar with back issues.  I spoke with the neurosurgeon on-call,  Dr. Maisie Fus and reviewed the MRI findings.  He advises patient can follow-up with Dr. Shon Baton as an outpatient.  Patient was given a shot of morphine and Decadron in the ED.  He was given a prescription for short course of Vicodin.  He will call Dr. Shon Baton office tomorrow for close follow-up.  Return precautions were given.  Final Clinical Impression(s) / ED Diagnoses Final diagnoses:  Radicular low back pain    Rx / DC Orders ED Discharge Orders         Ordered     HYDROcodone-acetaminophen (NORCO/VICODIN) 5-325 MG tablet  Every 4 hours PRN        03/25/20 1536           Rolan Bucco, MD 03/25/20 1539

## 2020-04-13 ENCOUNTER — Other Ambulatory Visit: Payer: Self-pay | Admitting: Orthopedic Surgery

## 2020-04-13 DIAGNOSIS — M545 Low back pain, unspecified: Secondary | ICD-10-CM

## 2020-04-17 ENCOUNTER — Telehealth: Payer: Self-pay

## 2020-04-17 NOTE — Telephone Encounter (Signed)
Phone call to patient to verify allergies and weight for upcoming intradiscal injection. Pt denies allergy to ancef antibiotics. Pt also advised to not take the oral antibiotic that was prescribed to him as we will give him IV antibiotics the day of his procedure. Pt verbalized understanding.

## 2020-04-23 ENCOUNTER — Other Ambulatory Visit: Payer: Self-pay

## 2020-04-23 ENCOUNTER — Ambulatory Visit
Admission: RE | Admit: 2020-04-23 | Discharge: 2020-04-23 | Disposition: A | Discharge status: Home / Self Care | Payer: 59 | Source: Ambulatory Visit | Attending: Orthopedic Surgery | Admitting: Orthopedic Surgery

## 2020-04-23 ENCOUNTER — Other Ambulatory Visit: Payer: Self-pay | Admitting: Orthopedic Surgery

## 2020-04-23 ENCOUNTER — Ambulatory Visit
Admission: RE | Admit: 2020-04-23 | Discharge: 2020-04-23 | Disposition: A | Discharge status: Home / Self Care | Payer: Self-pay | Source: Ambulatory Visit | Attending: Orthopedic Surgery | Admitting: Orthopedic Surgery

## 2020-04-23 DIAGNOSIS — G8929 Other chronic pain: Secondary | ICD-10-CM

## 2020-04-23 DIAGNOSIS — M545 Low back pain, unspecified: Secondary | ICD-10-CM

## 2020-04-23 MED ORDER — METHYLPREDNISOLONE ACETATE 40 MG/ML INJ SUSP (RADIOLOG
120.0000 mg | Freq: Once | INTRAMUSCULAR | Status: AC
  Administered 2020-04-23: 120 mg via INTRALESIONAL

## 2020-04-23 MED ORDER — IOPAMIDOL (ISOVUE-M 200) INJECTION 41%
1.0000 mL | Freq: Once | INTRAMUSCULAR | Status: AC
  Administered 2020-04-23: 1 mL

## 2020-04-23 MED ORDER — CEFAZOLIN SODIUM-DEXTROSE 2-4 GM/100ML-% IV SOLN
2.0000 g | INTRAVENOUS | Status: AC
  Administered 2020-04-23: 2 g via INTRAVENOUS

## 2020-04-23 NOTE — Discharge Instr - Other Orders (Signed)
Pt denies any itching, hives, shortness of breath, etc...  Explained to pt if any symptoms of a reaction were to develop within 24 hours such as itching, hives, shortness of breath, etc... pt should seek emergency care. Pt verbalized understanding.

## 2020-04-23 NOTE — Discharge Instructions (Signed)

## 2020-05-14 ENCOUNTER — Emergency Department (HOSPITAL_COMMUNITY)
Admission: EM | Admit: 2020-05-14 | Discharge: 2020-05-16 | Disposition: A | Discharge status: Home / Self Care | Payer: Worker's Compensation | Attending: Emergency Medicine | Admitting: Emergency Medicine

## 2020-05-14 ENCOUNTER — Encounter (HOSPITAL_COMMUNITY): Payer: Self-pay

## 2020-05-14 ENCOUNTER — Other Ambulatory Visit: Payer: Self-pay

## 2020-05-14 DIAGNOSIS — M5125 Other intervertebral disc displacement, thoracolumbar region: Secondary | ICD-10-CM | POA: Diagnosis not present

## 2020-05-14 DIAGNOSIS — R29898 Other symptoms and signs involving the musculoskeletal system: Secondary | ICD-10-CM | POA: Diagnosis not present

## 2020-05-14 DIAGNOSIS — I10 Essential (primary) hypertension: Secondary | ICD-10-CM | POA: Diagnosis not present

## 2020-05-14 DIAGNOSIS — J45909 Unspecified asthma, uncomplicated: Secondary | ICD-10-CM | POA: Insufficient documentation

## 2020-05-14 DIAGNOSIS — R39198 Other difficulties with micturition: Secondary | ICD-10-CM | POA: Insufficient documentation

## 2020-05-14 DIAGNOSIS — M545 Low back pain, unspecified: Secondary | ICD-10-CM | POA: Diagnosis present

## 2020-05-14 DIAGNOSIS — Z79899 Other long term (current) drug therapy: Secondary | ICD-10-CM | POA: Insufficient documentation

## 2020-05-14 DIAGNOSIS — R201 Hypoesthesia of skin: Secondary | ICD-10-CM | POA: Diagnosis not present

## 2020-05-14 DIAGNOSIS — E039 Hypothyroidism, unspecified: Secondary | ICD-10-CM | POA: Diagnosis not present

## 2020-05-14 DIAGNOSIS — R208 Other disturbances of skin sensation: Secondary | ICD-10-CM

## 2020-05-14 MED ORDER — OXYCODONE-ACETAMINOPHEN 5-325 MG PO TABS
1.0000 | ORAL_TABLET | Freq: Once | ORAL | Status: AC
  Administered 2020-05-14: 1 via ORAL
  Filled 2020-05-14: qty 1

## 2020-05-14 MED ORDER — LORAZEPAM 1 MG PO TABS: 1.0000 mg | ORAL_TABLET | Freq: Once | ORAL | Status: DC | PRN

## 2020-05-14 MED ORDER — DEXAMETHASONE SODIUM PHOSPHATE 10 MG/ML IJ SOLN
10.0000 mg | Freq: Once | INTRAMUSCULAR | Status: AC
  Administered 2020-05-14: 10 mg via INTRAMUSCULAR
  Filled 2020-05-14: qty 1

## 2020-05-14 NOTE — ED Triage Notes (Signed)
Patient arrived stating that he has mid back pain that has been going on since October. Reports over the last week he has felt like his legs were "heavy". Also reports he noticed he has not urinated in the last 8 hours and has no urgency to go.

## 2020-05-14 NOTE — ED Provider Notes (Signed)
[facility]   CSN: [id number] Arrival date & time: 05/14/20  2029     History Chief Complaint  Patient presents with  . Back Pain    Albert Watson is a 30 y.o. male.  Albert Watson is a 30 y.o. male with a history of hypertension, GERD, tobacco injury, who presents to the ED for evaluation of ongoing back pain now with progressively worsening heaviness of both legs with some tingling and decreased sensation.  Patient reports that today he noticed that he did not feel the urge to urinate over 8 hours despite drinking plenty of liquids and finally had to force himself to go to the bathroom and passed a large amount of urine.  Patient reports that in October he had an injury to his back while at work as he was lifting a heavy casket into a hurse.  He has been following with Dr. Shon BatonBrooks with [facility] and had MRIs done in January that showed 5 disc herniations in the thoracic spine with some ventral cord flattening most pronounced at T12-L1 with moderate canal stenosis, patient also with some lumbar disc herniations.  They have done some injections in the lumbar spine he has been using ibuprofen and briefly was on a course of tramadol but has not gotten much improvement in his pain.  Over the past 4 days he has noted progressive tingling and decreased sensation in both legs that he reports used to be intermittent but has become more persistent since Friday.  He also reports that over the weekend his legs have started to feel heavy and he has had some increased difficulty getting around but is still been able to walk.  Reports that he left the beach with his wife today, went 8 hours without having the urge to urinate despite drinking lots of [facility] today.  After 8 hours his wife had him go and try and he had to push significantly but was able to urinate a significant amount.  Reports had a normal bowel movement this morning.  His  orthopedist told him that if he ever had worsening symptoms like this that he should be evaluated in the emergency department.  He has not had any surgeries on his back.  No other aggravating or alleviating factors.        Past Medical History:  Diagnosis Date  . Asthma, chronic   . Dyspepsia   . Environmental allergies   . Familial combined hyperlipidemia   . GERD (gastroesophageal reflux disease)   . Goiter   . Hard of hearing   . Horner's syndrome   . Hypertension   . Hypothyroidism, acquired, autoimmune   . Obesity (BMI 30-39.9)   . Status post surgery for complex congenital heart disease   . Thyroiditis, autoimmune     Patient Active Problem List   Diagnosis Date Noted  . Status post surgery for complex congenital heart disease   . Familial combined hyperlipidemia   . Goiter   . Thyroiditis, autoimmune   . Dyspepsia   . Hypothyroidism, acquired, autoimmune   . GERD (gastroesophageal reflux disease)   . Hypertension   . Asthma, chronic   . Environmental allergies   . Horner's syndrome   . Hard of hearing   . Obesity (BMI 30-39.9)   . Essential hypertension, benign 08/23/2010    Past Surgical History:  Procedure Laterality Date  . CARDIAC SURGERY    . complex congenital heart disease    .  EXTERNAL EAR SURGERY    . KNEE SURGERY         Family History  Problem Relation Age of Onset  . Hyperlipidemia Mother        Increased cholesterol and triglycerides  . Diabetes Mother        T2 DM  . Obesity Mother   . Thyroid disease Father   . Hyperlipidemia Father        High cholesterol  . Heart disease Maternal Uncle        MI and CABG  . Heart disease Maternal Grandmother        CABG  . Hypertension Maternal Grandmother        T2 DM  . Heart disease Maternal Grandfather        CABG and MIs  . Cancer Maternal Grandfather        Skin  . Heart disease Paternal Grandfather        Died of MI    Social History   Tobacco Use  . Smoking status: Never  Smoker  . Smokeless tobacco: Current User    Types: Snuff  Substance Use Topics  . Alcohol use: No  . Drug use: No    Home Medications Prior to Admission medications   Medication Sig Start Date End Date Taking? Authorizing Provider  cetirizine (ZYRTEC ALLERGY) 10 MG tablet Take 1 tablet (10 mg total) by mouth daily. 07/25/17   Georgiana Shore, PA-C  doxycycline (VIBRAMYCIN) 100 MG capsule Take 1 capsule (100 mg total) by mouth 2 (two) times daily. Patient not taking: Reported on 07/24/2017 04/29/17   Ivery Quale, PA-C  ELDERBERRY PO Take 1 capsule by mouth daily.    [provider]  guaifenesin (ROBITUSSIN) 100 MG/5ML syrup Take 5-10 mLs (100-200 mg total) by mouth every 4 (four) hours as needed for cough. 07/25/17   Georgiana Shore, PA-C  HYDROcodone-acetaminophen (NORCO/VICODIN) 5-325 MG tablet Take 2 tablets by mouth every 4 (four) hours as needed. 03/25/20   Rolan Bucco, MD  levothyroxine (SYNTHROID, LEVOTHROID) 125 MCG tablet TAKE ONE TABLET BY MOUTH 6 DAYS A WEEK (MON-SAT) AND ONE-HALF TABLET BY MOUTH ON SUN 04/01/11   David Stall, MD  lisinopril (PRINIVIL,ZESTRIL) 10 MG tablet Take 1 tablet lisinopril 10 mg in the morning and 1/2 tablet 5 mg in the evening. 01/05/13   David Stall, MD  loratadine-pseudoephedrine (CLARITIN-D 12 HOUR) 5-120 MG tablet Take 1 tablet by mouth 2 (two) times daily. 04/29/17   Ivery Quale, PA-C  LORazepam (ATIVAN) 0.5 MG tablet Take 0.5 mg by mouth every 8 (eight) hours.    [provider]  ondansetron (ZOFRAN ODT) 4 MG disintegrating tablet Take 1 tablet (4 mg total) by mouth every 8 (eight) hours as needed for nausea or vomiting. 07/25/17   Mathews Robinsons B, PA-C  ranitidine (ZANTAC) 150 MG tablet Take 150 mg by mouth 2 (two) times daily as needed for heartburn.    [provider]  sodium chloride (OCEAN) 0.65 % SOLN nasal spray Place 1 spray into both nostrils as needed for congestion. 07/25/17   Georgiana Shore, PA-C    Allergies    Azithromycin, Codeine, Septra [bactrim], and Sulfa antibiotics  Review of Systems   Review of Systems  Constitutional: Negative for chills and fever.  HENT: Negative.   Respiratory: Negative for shortness of breath.   Cardiovascular: Negative for chest pain.  Gastrointestinal: Negative for abdominal pain, constipation, diarrhea, nausea and vomiting.  Genitourinary: Positive for  difficulty urinating. Negative for dysuria, flank pain, frequency and hematuria.  Musculoskeletal: Positive for back pain. Negative for arthralgias, gait problem, joint swelling, myalgias and neck pain.  Skin: Negative for color change, rash and wound.  Neurological: Positive for weakness and numbness.  All other systems reviewed and are negative.   Physical Exam Updated Vital Signs BP 126/81 (BP Location: Left Arm)   Pulse (!) 115   Temp 98.4 F (36.9 C) (Oral)   Resp (!) 29   Ht 5\' 7"  (1.702 m)   Wt 97.5 kg   SpO2 95%   BMI 33.67 kg/m   Physical Exam Vitals and nursing note reviewed.  Constitutional:      General: He is not in acute distress.    Appearance: Normal appearance. He is well-developed and well-nourished. He is not ill-appearing or diaphoretic.     Comments: Patient is alert, appears uncomfortable but is overall well-appearing and in no acute distress.  HENT:     Head: Atraumatic.  Eyes:     General:        Right eye: No discharge.        Left eye: No discharge.  Cardiovascular:     Pulses:          Radial pulses are 2+ on the right side and 2+ on the left side.       Dorsalis pedis pulses are 2+ on the right side and 2+ on the left side.       Posterior tibial pulses are 2+ on the right side and 2+ on the left side.  Pulmonary:     Effort: Pulmonary effort is normal. No respiratory distress.  Abdominal:     General: Bowel sounds are normal. There is no distension.     Palpations: Abdomen is soft. There is no mass.     Tenderness: There is no  abdominal tenderness. There is no guarding.     Comments: Abdomen soft, nondistended, nontender to palpation in all quadrants without guarding or peritoneal signs, no CVA tenderness bilaterally  Musculoskeletal:        General: Tenderness present.     Cervical back: Neck supple.     Comments: Midline and paraspinal tenderness over the mid to lower thoracic spine and upper lumbar spine without palpable step-off or deformity, no overlying skin changes pain made worse with movement of lower extremities.  Skin:    General: Skin is warm and dry.     Capillary Refill: Capillary refill takes less than 2 seconds.  Neurological:     Mental Status: He is alert and oriented to person, place, and time.     Comments: Alert, clear speech, following commands. Moving all extremities without difficulty. 5/5 strength in bilateral upper extremities.  In the left lower extremity patient with 5/5 strength, pain worsened with movement, 4.5/5 strength in the right lower extremity sensation is intact but decreased in bilateral lower extremities Antalgic gait  Psychiatric:        Mood and Affect: Mood and affect and mood normal.        Behavior: Behavior normal.     ED Results / Procedures / Treatments   Labs (all labs ordered are listed, but only abnormal results are displayed) Labs Reviewed - No data to display  EKG None  Radiology No results found.  Procedures Procedures   Medications Ordered in ED Medications  LORazepam (ATIVAN) tablet 1 mg (has no administration in time range)  oxyCODONE-acetaminophen (PERCOCET/ROXICET) 5-325 MG per tablet 1 tablet (  1 tablet Oral Given 05/14/20 2316)  dexamethasone (DECADRON) injection 10 mg (10 mg Intramuscular Given 05/14/20 2317)    ED Course  I have reviewed the triage vital signs and the nursing notes.  Pertinent labs & imaging results that were available during my care of the patient were reviewed by me and considered in my medical decision making (see  chart for details).    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  Over the past 4 days he has had progressively more persistent lower extremity heaviness, tingling and decreased sensation and today over an 8-hour.  Did not feel the urge to urinate despite drinking lots of fluids, had to force himself to urinate.  Normal bowel movements.  On exam he has some 4.5/5 weakness in the right lower extremity and decreased sensation in bilateral lower extremities.  Given a known disc disease feel patient will need emergent MRIs of the thoracic and lumbar spine to rule out worsening cord compression or other new acute abnormalities.  Unfortunately MRIs not available at this hour at [facility], patient will need transfer to [facility].  We will treat symptoms with Percocet and IM Decadron.  I discussed case with Dr. Eber Hong at [facility] emergency department who accepts patient for ED to ED transfer for MRI imaging.  Discussed with patient that he may have to sit in the waiting room until room is available while he waits to get his imaging, he and wife expressed understanding with this, wife is able to transport patient to the emergency department they have been instructed to go straight to the emergency department and not to stop anywhere on the way.    At this time patient is stable for transfer to [facility] for further imaging and evaluation.  Final Clinical Impression(s) / ED Diagnoses Final diagnoses:  Difficulty urinating  Herniation of thoracolumbar intervertebral disc  Decreased sensation of leg    Rx / DC Orders ED Discharge Orders    None       Legrand Rams 05/14/20 2343    Tilden Fossa, MD 05/15/20 0001

## 2020-05-14 NOTE — ED Notes (Signed)
Pt left via private vehicle to [facility] ED, accompanied by wife.

## 2020-05-14 NOTE — ED Notes (Signed)
Bladder scan volume: 28 mL.

## 2020-05-15 ENCOUNTER — Emergency Department (HOSPITAL_COMMUNITY): Payer: Worker's Compensation

## 2020-05-15 DIAGNOSIS — R29898 Other symptoms and signs involving the musculoskeletal system: Secondary | ICD-10-CM

## 2020-05-15 MED ORDER — FENTANYL CITRATE (PF) 100 MCG/2ML IJ SOLN
50.0000 ug | Freq: Once | INTRAMUSCULAR | Status: AC
  Administered 2020-05-15: 50 ug via INTRAVENOUS
  Filled 2020-05-15 (×2): qty 2

## 2020-05-15 MED ORDER — OXYCODONE-ACETAMINOPHEN 5-325 MG PO TABS
1.0000 | ORAL_TABLET | Freq: Once | ORAL | Status: AC
  Administered 2020-05-15: 1 via ORAL
  Filled 2020-05-15: qty 1

## 2020-05-15 MED ORDER — DULOXETINE HCL 60 MG PO CPEP: 90.0000 mg | ORAL_CAPSULE | Freq: Every day | ORAL | Status: DC

## 2020-05-15 MED ORDER — SODIUM CHLORIDE 0.9 % IV BOLUS: 1000.0000 mL | Freq: Once | INTRAVENOUS | Status: DC

## 2020-05-15 MED ORDER — ONDANSETRON HCL 4 MG/2ML IJ SOLN
4.0000 mg | Freq: Once | INTRAMUSCULAR | Status: AC
  Administered 2020-05-15: 4 mg via INTRAVENOUS
  Filled 2020-05-15: qty 2

## 2020-05-15 MED ORDER — HYDROMORPHONE HCL 1 MG/ML IJ SOLN
1.0000 mg | Freq: Once | INTRAMUSCULAR | Status: AC
  Administered 2020-05-15: 1 mg via INTRAVENOUS
  Filled 2020-05-15: qty 1

## 2020-05-15 MED ORDER — GABAPENTIN 300 MG PO CAPS
300.0000 mg | ORAL_CAPSULE | Freq: Once | ORAL | Status: AC
  Administered 2020-05-15: 300 mg via ORAL
  Filled 2020-05-15: qty 1

## 2020-05-15 MED ORDER — OXYCODONE HCL 5 MG PO TABS: 5.0000 mg | ORAL_TABLET | Freq: Four times a day (QID) | ORAL | 0 refills | Status: AC | PRN

## 2020-05-15 MED ORDER — METHYLPREDNISOLONE 4 MG PO TBPK: ORAL_TABLET | ORAL | 0 refills | Status: DC

## 2020-05-15 MED ORDER — LISINOPRIL 10 MG PO TABS: 10.0000 mg | ORAL_TABLET | Freq: Once | ORAL | Status: DC

## 2020-05-15 MED ORDER — GADOBUTROL 1 MMOL/ML IV SOLN
10.0000 mL | Freq: Once | INTRAVENOUS | Status: AC | PRN
  Administered 2020-05-15: 10 mL via INTRAVENOUS

## 2020-05-15 MED ORDER — LISINOPRIL 10 MG PO TABS
15.0000 mg | ORAL_TABLET | Freq: Once | ORAL | Status: AC
  Administered 2020-05-15: 15 mg via ORAL
  Filled 2020-05-15: qty 2

## 2020-05-15 MED ORDER — CYCLOBENZAPRINE HCL 10 MG PO TABS: 10.0000 mg | ORAL_TABLET | Freq: Three times a day (TID) | ORAL | 0 refills | Status: AC

## 2020-05-15 MED ORDER — LORAZEPAM 2 MG/ML IJ SOLN
1.0000 mg | INTRAMUSCULAR | Status: DC | PRN
  Administered 2020-05-15: 1 mg via INTRAVENOUS
  Filled 2020-05-15: qty 1

## 2020-05-15 NOTE — Consult Note (Signed)
Neurology Consultation Reason for Consult: paraparesis Referring Physician: ED, Dr. Rhunette Croft  CC: paraparesis  History is obtained from: patient and his wife.  HPI: Albert Watson is a 30 y.o. male with a history of work-related injury several months ago. He works in [employer] and was lifting a casket when he felt a pop and felt a sensation extend from his neck throughout his body. He has undergone extensive workup and was found to have T6 and lumbar disc bulges. I reviewed the images thoroughly, and see no T2/STIR cord signal hyperintensity, though there is some right-sided cord effacement at T6. There is abundant CSF signal surrounding the cord at this level, as well as the lumbar site, where there is thecal sac indentation and an extruded disc fragment. He notes an 8 hour period yesterday during which he was unable to void. His orthopedics doctors have apparently considered doing surgery on his back for these abnormalities, but are getting "jerked around" by [employer]. Also, there may be a need for more specialized orthopedics care, he tells me, and he was supposed to be referred to [facility] for further evaluation of these issues. I will defer to neurosurgery/orthopedics, regarding the need for such procedures. I was asked to assess him, not to advise if he needs surgery, but to determine if there are some other causes at work. He didn't like this interpretation, and became agitated. He insists that he was told that I was supposed to come and make recommendations for surgery, because he can't work, can't play with his children, and is in chronic pain for months and feels that he could become paralyzed from the "nipples down" unless something is done. I explained that after review of his MRI scans, I do not believe he's in imminent danger of a rapid decline in neurological status. Nevertheless, he and his wife seem adamant that a more urgent treatment plan is required, partly because of the  obstacles put in place by [employer] during outpatient evaluation and treatment.   ROS: A 14 point ROS was performed and is negative except as noted in the HPI. Urinary retention x 8 hours yesterday. No prior neurological events to suggest a demyelinating process. Weakness/numbness noted in both hands/arms, and both legs.  Past Medical History:  Diagnosis Date  . Asthma, chronic   . Dyspepsia   . Environmental allergies   . Familial combined hyperlipidemia   . GERD (gastroesophageal reflux disease)   . Goiter   . Hard of hearing   . Horner's syndrome   . Hypertension   . Hypothyroidism, acquired, autoimmune   . Obesity (BMI 30-39.9)   . Status post surgery for complex congenital heart disease   . Thyroiditis, autoimmune     Family History  Problem Relation Age of Onset  . Hyperlipidemia Mother        Increased cholesterol and triglycerides  . Diabetes Mother        T2 DM  . Obesity Mother   . Thyroid disease Father   . Hyperlipidemia Father        High cholesterol  . Heart disease Maternal Uncle        MI and CABG  . Heart disease Maternal Grandmother        CABG  . Hypertension Maternal Grandmother        T2 DM  . Heart disease Maternal Grandfather        CABG and MIs  . Cancer Maternal Grandfather  Skin  . Heart disease Paternal Grandfather        Died of MI   Social History:  reports that he has never smoked. His smokeless tobacco use includes snuff. He reports that he does not drink alcohol and does not use drugs.  Exam: Current vital signs: BP 133/87   Pulse 88   Temp 98.1 F (36.7 C)   Resp (!) 22   Ht 5\' 7"  (1.702 m)   Wt 97.5 kg   SpO2 98%   BMI 33.67 kg/m  Vital signs in last 24 hours: Temp:  [98.1 F (36.7 C)-98.4 F (36.9 C)] 98.1 F (36.7 C) (02/15 0757) Pulse Rate:  [84-130] 88 (02/15 1400) Resp:  [13-29] 22 (02/15 1400) BP: (121-155)/(67-101) 133/87 (02/15 1400) SpO2:  [93 %-100 %] 98 % (02/15 1400) Weight:  [97.5 kg]  97.5 kg (02/14 2055)   Physical Exam  Constitutional: Appears well-developed and well-nourished.  Psych: Affect appropriate to situation, though agitated. Eyes: No scleral injection HENT: No OP obstrucion MSK: no joint deformities.  Cardiovascular: Normal rate and regular rhythm.  Respiratory: Effort normal, non-labored breathing Skin: WDI  Neuro: Mental Status: Patient is awake, alert, oriented to person, place, month, year, and situation. Patient is able to give a clear and coherent history. No signs of aphasia or neglect. Cranial Nerves: II: Visual Fields are full. Pupils are equal, round, and reactive to light. I do not appreciate Horner's (examined only in the light). III,IV, VI: EOMI without ptosis or diploplia.  V: Facial sensation is symmetric to temperature VII: Facial movement is symmetric.  VIII: hearing is intact to voice X: Uvula elevates symmetrically XI: Shoulder shrug is symmetric. XII: tongue is midline without atrophy or fasciculations.  Motor: Tone is normal. Bulk is normal. Weak hand grip strength, able to put forth 5/5 power in the biceps and triceps. In lower extremities, hip flexion 5/5 bilaterally, knee flexion 5/5 on the left, 4+/5 on the right. Knee extension 5/5 bilaterally. Dorsiflexion notable for give-way weakness on both sides. Plantar flexion 5/5 on the left, 4+/5 on the right with instability of inversion/eversion. Sensory: Sensation is asymmetric to light touch, temperature and vibration in the right arms and leg. There is no clear dermatomal pattern. Medial and posterior aspect of right upper leg spared. Deep Tendon Reflexes: 2+ and symmetric throughout, with the exception that the left knee jerk is 3+. Plantars: Toes are mute bilaterally. Cerebellar: FNF and FO are intact bilaterally. Gait:  He was able to ambulate around the room with contact guard assistance. He could walk on tiptoe, and had trouble walking on his right heel.   I have  reviewed labs in epic and the results pertinent to this consultation are: Thyroid studies, HbA1c, CMP and CBC all look OK.  I have reviewed the images obtained: as described above. Imaging reviewed in detail.  Impression:  Thoracic and lumbar disc bulges/herniations with cord effacement at ~T6. There is no intrinsic cord signal hyperintensity on T2/STIR, and he has ample CSF signal surrounding the cord at this level. No imaging abnormality seen to account for the entirety of his symptoms (I.e., bilateral hand/arm involvement). He mentions progressive symptoms, and indeed has reflex asymmetry at the knees, with worsening sensation over the right leg.   Recommendations: 1) Cervical and brain MRI to rule out CNS demyelinating lesion, or another herniated disc in the neck causing bilateral hand/leg symptoms. 2) when I suggested that this workup could be done as an outpatient, he and his wife both  became upset. Due to the urinary retention yesterday, the involvement of both hands/arms, and his great fear of becoming permanently disabled, I suggest MRI cervical and brain with and without contrast to expedite the workup. 3) It would be valuable to have neurosurgery weigh in for a second opinion regarding the need for surgery at T6 or the lumbar area, though these cannot explain all his symptoms.  4) If the brain and cervical spine show signs of demyelination, then additional recommendations (e.g., CSF examination) may be forthcoming.  Will continue to follow along.   Aram Candela [facility]

## 2020-05-15 NOTE — Discharge Instructions (Signed)
Neurology, neurosurgery and physical therapy evaluated you here in the emergency department  MRIs were performed here as well  At this time there is no emergent need for surgical intervention or further diagnostic tests  Take 500 to 1000 mg of acetaminophen every 6-8 hours for inflammation and pain.  Take oxycodone 5 mg every 8 hours for breakthrough more severe pain.  Take cyclobenzaprine 10 mg every 8 hours for spasms and tightness.  Finished methylprednisone taper.  Continue gabapentin.  Please follow-up with Dr. Shon Baton for any further outpatient referrals  Return to the ED for any worsening or new symptoms

## 2020-05-15 NOTE — Progress Notes (Signed)
Physical Therapy Evaluation Patient Details Name: Albert Watson MRN: [id number] DOB: [birth year] Today's Date: 05/15/2020   History of Present Illness  Pt is a 30 y/o male admitted for progressive weakness and increased back pain. 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 PMH includes HTN.  Clinical Impression  Pt admitted secondary to problem above with deficits below. Pt presenting with notable weakness in RLE vs LLE. Also reporting a "heaviness" sensation throughout BLE. Noted functional weakness in RLE during ambulation and pt reports feeling as if his R knee would buckle. Pt reporting increased difficulty performing mobility and ADL tasks and reports he feels like symptoms have worsened. Wife present and agrees that pt has had increased difficulty performing mobility tasks and reports she has had to assist him with ADL tasks. Discussed use of DME to help increase safety with mobility. Do feel he would benefit from outpatient PT if cleared by MD. Will continue to follow acutely to maximize functional mobility independence and safety.     Follow Up Recommendations Outpatient PT (outpatient PT if cleared by MD to participate)    Equipment Recommendations  Cane;Other (comment) (shower chair)    Recommendations for Other Services       Precautions / Restrictions Precautions Precautions: Back Precaution Booklet Issued: No Precaution Comments: Verbally reviewed back precautions with pt. Restrictions Weight Bearing Restrictions: No      Mobility  Bed Mobility Overal bed mobility: Needs Assistance Bed Mobility: Rolling;Sidelying to Sit;Sit to Sidelying Rolling: Supervision Sidelying to sit: Supervision     Sit to sidelying: Supervision General bed mobility comments: Supervision for safety.    Transfers Overall transfer level: Needs assistance Equipment used:  None Transfers: Sit to/from Stand Sit to Stand: Min guard         General transfer comment: Min guard for safety. Increased time required to perform.  Ambulation/Gait Ambulation/Gait assistance: Min guard Gait Distance (Feet): 20 Feet Assistive device: None Gait Pattern/deviations: Step-to pattern;Decreased step length - right;Decreased step length - left;Decreased weight shift to right Gait velocity: Decreased   General Gait Details: Functional weakness noted in RLE. Tended to put limited weight out of fear of RLE buckling. Discussed using cane for mobility tasks to increase safety.  Stairs            Wheelchair Mobility    Modified Rankin (Stroke Patients Only)       Balance Overall balance assessment: Mild deficits observed, not formally tested                                           Pertinent Vitals/Pain Pain Assessment: 0-10 Pain Score: 6  Pain Location: mid back Pain Descriptors / Indicators: Stabbing Pain Intervention(s): Limited activity within patient's tolerance;Monitored during session;Repositioned    Home Living Family/patient expects to be discharged to:: Private residence Living Arrangements: Spouse/significant other Available Help at Discharge: Family Type of Home: House Home Access: Stairs to enter Entrance Stairs-Rails: Can reach both Entrance Stairs-Number of Steps: 1 (threshold) Home Layout: Two level;Able to live on main level with bedroom/bathroom Home Equipment: None      Prior Function Level of Independence: Needs assistance   Gait / Transfers Assistance Needed: Reports increased difficulty with ambulation. Reports RLE has been "giving way" and has had to catch himself multiple times.  ADL's / Homemaking Assistance Needed: Pt reports his wife has had to assist him with putting socks on and lower body ADL tasks.        Hand Dominance        Extremity/Trunk Assessment   Upper Extremity Assessment Upper  Extremity Assessment: RUE deficits/detail RUE Deficits / Details: Noted RUE weakness in grip strength and elbow flexion/extension. Grossly 4/5. Pt reports "heaviness" in RLE RUE Sensation: decreased light touch    Lower Extremity Assessment Lower Extremity Assessment: RLE deficits/detail RLE Deficits / Details: Increased weakness in RLE noted as well as functional weakness. Grossly 3+/5 throughout.    Cervical / Trunk Assessment Cervical / Trunk Assessment: Other exceptions Cervical / Trunk Exceptions: hx of lumbar surgery.  Communication   Communication: No difficulties  Cognition Arousal/Alertness: Awake/alert Behavior During Therapy: WFL for tasks assessed/performed Overall Cognitive Status: Within Functional Limits for tasks assessed                                        General Comments General comments (skin integrity, edema, etc.): Pt's wife present during session.    Exercises     Assessment/Plan    PT Assessment Patient needs continued PT services  PT Problem List Decreased strength       PT Treatment Interventions DME instruction;Gait training;Functional mobility training;Therapeutic activities;Therapeutic exercise;Balance training;Patient/family education;Stair training    PT Goals (Current goals can be found in the Care Plan section)  Acute Rehab PT Goals Patient Stated Goal: to fix his back PT Goal Formulation: With patient Time For Goal Achievement: 05/29/20 Potential to Achieve Goals: Good    Frequency Min 3X/week   Barriers to discharge        Co-evaluation               AM-PAC PT "6 Clicks" Mobility  Outcome Measure Help needed turning from your back to your side while in a flat bed without using bedrails?: A Little Help needed moving from lying on your back to sitting on the side of a flat bed without using bedrails?: A Little Help needed moving to and from a bed to a chair (including a wheelchair)?: A Little Help needed  standing up from a chair using your arms (e.g., wheelchair or bedside chair)?: A Little Help needed to walk in hospital room?: A Little Help needed climbing 3-5 steps with a railing? : A Little 6 Click Score: 18    End of Session Equipment Utilized During Treatment: Gait belt Activity Tolerance: Patient limited by pain Patient left: in bed;with call bell/phone within reach;with family/visitor present (on stretcher in ED) Nurse Communication: Mobility status PT Visit Diagnosis: Unsteadiness on feet (R26.81);Muscle weakness (generalized) (M62.81)    Time: 2202-5427 PT Time Calculation (min) (ACUTE ONLY): 26 min   Charges:   PT Evaluation $PT Eval Moderate Complexity: 1 Mod PT Treatments $Gait Training: 8-22 mins        Cindee Salt, DPT  Acute Rehabilitation Services  Pager: [phone] Office: [phone]   Lehman Prom 05/15/2020, 3:08 PM

## 2020-05-15 NOTE — Consult Note (Addendum)
Chief complaint: Severe increase in thoracic pain  HPI: Albert Watson is a pleasant 30 year old gentleman who is been under my care for some time now.  He unfortunately had a work-related injury in October 2021 resulting in significant thoracic and lumbar pain.  He had a prior lumbar discectomy approximately a year ago and he states he was in satisfactory condition with no significant pain prior to the work injury.  Patient had an MRI which demonstrated thoracic disc herniations at multiple levels but no significant cord contusion or cord compression.  He had no severe significant spinal stenosis or cord signal changes.  Attempted pain medical management had failed to alleviate his symptoms.  Patient noted that he was having increasing lumbar pain greater than his thoracic pain approximately 2 to 3 weeks ago.  He states that recently after returning home from visiting his relatives that he had severe increase in his thoracic pain as well as urinary retention.  As result he presented to the emergency room for further evaluation and treatment.   Past Medical History:  Diagnosis Date  . Asthma, chronic   . Dyspepsia   . Environmental allergies   . Familial combined hyperlipidemia   . GERD (gastroesophageal reflux disease)   . Goiter   . Hard of hearing   . Horner's syndrome   . Hypertension   . Hypothyroidism, acquired, autoimmune   . Obesity (BMI 30-39.9)   . Status post surgery for complex congenital heart disease   . Thyroiditis, autoimmune     Allergies  Allergen Reactions  . Azithromycin Hives    Pt unsure, received when he was little.   . Codeine Hives    Pt unsure, received when he was little.   Doylene Bode. Septra [Bactrim] Hives    Pt unsure, received when he was little.   . Sulfa Antibiotics Hives    Pt unsure, received when he was little.     No current facility-administered medications on file prior to encounter.   Current Outpatient Medications on File Prior to Encounter   Medication Sig Dispense Refill  . cetirizine (ZYRTEC ALLERGY) 10 MG tablet Take 1 tablet (10 mg total) by mouth daily. 30 tablet 0  . doxycycline (VIBRAMYCIN) 100 MG capsule Take 1 capsule (100 mg total) by mouth 2 (two) times daily. (Patient not taking: Reported on 07/24/2017) 14 capsule 0  . ELDERBERRY PO Take 1 capsule by mouth daily.    Marland Kitchen. guaifenesin (ROBITUSSIN) 100 MG/5ML syrup Take 5-10 mLs (100-200 mg total) by mouth every 4 (four) hours as needed for cough. 60 mL 0  . HYDROcodone-acetaminophen (NORCO/VICODIN) 5-325 MG tablet Take 2 tablets by mouth every 4 (four) hours as needed. 10 tablet 0  . levothyroxine (SYNTHROID, LEVOTHROID) 125 MCG tablet TAKE ONE TABLET BY MOUTH 6 DAYS A WEEK (MON-SAT) AND ONE-HALF TABLET BY MOUTH ON SUN 30 tablet 4  . lisinopril (PRINIVIL,ZESTRIL) 10 MG tablet Take 1 tablet lisinopril 10 mg in the morning and 1/2 tablet 5 mg in the evening. 45 tablet 5  . loratadine-pseudoephedrine (CLARITIN-D 12 HOUR) 5-120 MG tablet Take 1 tablet by mouth 2 (two) times daily. 20 tablet 0  . LORazepam (ATIVAN) 0.5 MG tablet Take 0.5 mg by mouth every 8 (eight) hours.    . ondansetron (ZOFRAN ODT) 4 MG disintegrating tablet Take 1 tablet (4 mg total) by mouth every 8 (eight) hours as needed for nausea or vomiting. 20 tablet 0  . ranitidine (ZANTAC) 150 MG tablet Take 150 mg by mouth 2 (  two) times daily as needed for heartburn.    . sodium chloride (OCEAN) 0.65 % SOLN nasal spray Place 1 spray into both nostrils as needed for congestion. 30 mL 0   ROS: A 14 point ROS was performed and is negative except as noted in the HPI. Urinary retention x 8 hours yesterday. No prior neurological events to suggest a demyelinating process. Weakness/numbness noted in both hands/arms, and both legs.  Prior surgical intervention with well-healed lumbar incision from microdiscectomy.  Physical Exam: Vitals:   05/15/20 1704 05/15/20 1715  BP: (!) 175/93 (!) 175/93  Pulse:  (!) 110  Resp:  (!) 21   Temp:    SpO2:  100%   Body mass index is 33.67 kg/m. He is alert and oriented x3. No shortness of breath or chest pain at present. Significant thoracic pain with palpation.  Pain is along the entire thoracic spine radiating from the midline out to the paraspinal region.  He is also complaining of significant lumbar spine pain.  He is able to use get out of bed and stand under his own power.  But he does note that there is increased pain with ambulation.  Neuro exam: Normal muscle tone in the lower extremity with no atrophy.  5/5 motor strength in the lower extremity bilaterally.  Negative Babinski test.  Symmetrical deep tendon reflexes. Rectal tone is intact.  Perianal sensation is intact to light touch and pinprick.  Patient notes decreased sensation to light touch in the right L3, L4, and L5 dermatome.  Positive clonus bilaterally in the lower extremity with 3 beats.  No localized joint pain with passive range of motion of the hip, knee, ankle or shoulder, elbow, wrist.  Image: MR THORACIC SPINE WO CONTRAST  Result Date: 05/15/2020 CLINICAL DATA:  Low back pain with progressive neurologic deficit. Heaviness of both legs with tingling and decreased sensation. EXAM: MRI THORACIC AND LUMBAR SPINE WITHOUT CONTRAST TECHNIQUE: Multiplanar and multiecho pulse sequences of the thoracic and lumbar spine were obtained without intravenous contrast. COMPARISON:  None. FINDINGS: MRI THORACIC SPINE FINDINGS Alignment:  Negative for listhesis. Vertebrae: Superior endplate Schmorl's node at T10 with adjacent marrow edematous signal that is mild. T10 superior endplate fracture deformity with adjacent fatty marrow conversion. Remote superior endplate fracture of T6 with fatty marrow conversion. No evidence of discitis or aggressive bone lesion. Cord:  Normal signal and morphology. Paraspinal and other soft tissues: Right hepatic cyst. Right aortic arch. Disc levels: T5-6 small right paracentral protrusion.  T6-7 right paracentral protrusion with upward migration and mild flattening of the right cord. T7-8 right paracentral disc protrusion with upward pointing, contacting and mildly flattening the right cord. T8-9 central to left paracentral protrusion with focal mass effect on the cord. T11-12 upward pointing left paracentral protrusion contacting but not compressing the cord. No noted facet arthritis or foraminal impingement. MRI LUMBAR SPINE FINDINGS Segmentation:  5 lumbar type vertebrae Alignment:  Borderline retrolisthesis at L3-4 and L4-5. Vertebrae:  No fracture, evidence of discitis, or bone lesion. Conus medullaris and cauda equina: Conus extends to the L1 level. Conus and cauda equina appear normal. Paraspinal and other soft tissues: Expected postoperative scarring on the right and posteriorly at L4-5. Disc levels: T12- L1: Unremarkable. L1-L2: Unremarkable. L2-L3: Unremarkable. L3-L4: Disc desiccation and narrowing with mild bulge. No impingement L4-L5: Disc desiccation and narrowing with right paracentral annular fissure and protrusion at a micro discectomy site. No compressive stenosis. L5-S1:Mild facet spurring. Conjoined L5 and S1 root sleeves on the right.  IMPRESSION: MR THORACIC SPINE IMPRESSION 1. 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 A Schmorl's node at T10 is associated with mild marrow edema. 2. Multiple disc protrusions with focal mass effect where contacting the cord, as above. No cord edema or high-grade spinal stenosis. MR LUMBAR SPINE IMPRESSION 1. No emergent finding or neural impingement. 2. 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 Electronically Signed   By: Marnee Spring M.D.   On: 05/15/2020 04:43   MR LUMBAR SPINE WO CONTRAST  Result Date: 05/15/2020 CLINICAL DATA:  Low back pain with progressive neurologic deficit. Heaviness of both legs with tingling and decreased sensation. EXAM: MRI THORACIC AND LUMBAR SPINE WITHOUT CONTRAST TECHNIQUE: Multiplanar  and multiecho pulse sequences of the thoracic and lumbar spine were obtained without intravenous contrast. COMPARISON:  None. FINDINGS: MRI THORACIC SPINE FINDINGS Alignment:  Negative for listhesis. Vertebrae: Superior endplate Schmorl's node at T10 with adjacent marrow edematous signal that is mild. T10 superior endplate fracture deformity with adjacent fatty marrow conversion. Remote superior endplate fracture of T6 with fatty marrow conversion. No evidence of discitis or aggressive bone lesion. Cord:  Normal signal and morphology. Paraspinal and other soft tissues: Right hepatic cyst. Right aortic arch. Disc levels: T5-6 small right paracentral protrusion. T6-7 right paracentral protrusion with upward migration and mild flattening of the right cord. T7-8 right paracentral disc protrusion with upward pointing, contacting and mildly flattening the right cord. T8-9 central to left paracentral protrusion with focal mass effect on the cord. T11-12 upward pointing left paracentral protrusion contacting but not compressing the cord. No noted facet arthritis or foraminal impingement. MRI LUMBAR SPINE FINDINGS Segmentation:  5 lumbar type vertebrae Alignment:  Borderline retrolisthesis at L3-4 and L4-5. Vertebrae:  No fracture, evidence of discitis, or bone lesion. Conus medullaris and cauda equina: Conus extends to the L1 level. Conus and cauda equina appear normal. Paraspinal and other soft tissues: Expected postoperative scarring on the right and posteriorly at L4-5. Disc levels: T12- L1: Unremarkable. L1-L2: Unremarkable. L2-L3: Unremarkable. L3-L4: Disc desiccation and narrowing with mild bulge. No impingement L4-L5: Disc desiccation and narrowing with right paracentral annular fissure and protrusion at a micro discectomy site. No compressive stenosis. L5-S1:Mild facet spurring. Conjoined L5 and S1 root sleeves on the right. IMPRESSION: MR THORACIC SPINE IMPRESSION 1. Chronic superior endplate fractures at T6 and  T10. A Schmorl's node at T10 is associated with mild marrow edema. 2. Multiple disc protrusions with focal mass effect where contacting the cord, as above. No cord edema or high-grade spinal stenosis. MR LUMBAR SPINE IMPRESSION 1. No emergent finding or neural impingement. 2. 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 Electronically Signed   By: Marnee Spring M.D.   On: 05/15/2020 04:43   DG Diskogram Lumbar  Result Date: 04/23/2020 CLINICAL DATA:  Chronic low back pain. Prior L4-L5 microdiscectomy. Hypointense material appearing to extend from and in continuity with the L4-L5 disc, extending into the right subarticular zone with associated right lateral recess narrowing and abutment upon the descending right L5 nerve root. Findings are favored to represent a disc protrusion given continuous appearance with the disc, less likely scar. 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 EXAM: LUMBAR DISK INJECTION UNDER FLUOROSCOPY FLUOROSCOPY TIME:  36 SECONDS; 45 uGym2 DAP PROCEDURE: The procedure was discussed in depth with the patient including the potential risk of infection. As antibiotic prophylaxis, CEFAZOLIN 2 G was ordered pre-procedure and administered intravenously within one hour of incision. The patient was placed prone on the fluoroscopic table. A  Betadine scrub of the low back was performed and the patient was draped in a sterile fashion. Skin anesthesia was carried out using 1% Lidocaine. 15 cm 22 gauge Chiba needle directed into the nuclear region of the L4-5 disc. Needle tip position confirmed in 3 projections. 120 mg Depo-Medrol in 1.5 mL Sensorcaine 0.5% was administered. IMPRESSION: 1. Technically successful lumbar L4-5 disc injection under fluoroscopy. Electronically Signed   By: Corlis Leak M.D.   On: 04/23/2020 14:20    A/P: Patient presented to the emergency room yesterday, and transferred to [facility], ER for MRI evaluation.  Patient reports that yesterday on his way home he  noted urinary retention but no incontinence.  He has had horrific increase in his thoracic pain.  Initially states the pain was so bad he could not walk.  On clinical exam today he does not show any signs of myelopathy.  He has a negative Babinski test, no clonus that I can appreciate, and no signs or symptoms of a cauda equina syndrome (normal rectal tone and perianal sensation).  I have reviewed his thoracic MRI and it does not show any significant changes from a prior MRI that we obtained last night.  Although there are disc protrusions there is still CSF surrounding the cord and there is no cord signal changes.  I have reviewed this MRI with Dr. Maisie Fus from neurosurgery and he agrees that there is no evidence to support urgent surgical intervention.  I agree with the neurology evaluation provided by Dr.Nanavati.  The patient will go for a cervical and brain MRI further recommendations pending the results of the studies.  But I agree he does not have any focal neurological deficits that would warrant acute surgical intervention.  I did explain to the patient over the phone on Monday that I do not think there is anything further that I personally could add to his care.  At that time I recommended a referral to [facility] for further evaluation and treatment.  That of course is not occurred as he has been in the hospital since Monday evening.  I had a long conversation today with the patient and his wife and unfortunately there is nothing more that I can add to his care.  I did tell them that technically I do not feel comfortable performing a thoracic multilevel decompression nor do I think that is necessary.  The patient expressed significant concerns about returning to work, and so at this point I think it is fair to take him out of work pending the review from the Writer at [facility].  I recommend Dr. Joanette Gula.  In addition I am recommending referral to a pain specialist to  address his severe debilitating thoracic and lumbar pain.  I spoken with the ER care team and indicated that if there is no significant findings on the cervical or brain MRI and the patient states he has too much pain to be discharged that I would recommend admission to the hospitalist team for pain medical management as well as physical therapy.    I will continue to move forward with the outpatient consultation with Dr. Joanette Gula at [facility].  If there is any further questions concerns please not hesitate to contact me.

## 2020-05-15 NOTE — ED Provider Notes (Signed)
  Physical Exam  BP (!) 104/92 (BP Location: Left Arm)   Pulse (!) 47   Temp 98.1 F (36.7 C)   Resp 16   Ht 5\' 7"  (1.702 m)   Wt 97.5 kg   SpO2 99%   BMI 33.67 kg/m   Physical Exam  ED Course/Procedures   Clinical Course as of 05/15/20 2139  Tue May 15, 2020  1849 Low back pain +tingling urinary retention yesterday Seen by neurology here in ER   Neurosurgery seen in ER No surgical intervention  Pending MRI ?MS  On opioid pain medicines, gabapentin Consider admission for pain management  PT evaluated and recommended a cane  Anticipate discharge  [CG]  2116 MR Brain W and Wo Contrast IMPRESSION: 1. A few nonspecific hyperintense T2-weighted signal foci within the supratentorial white matter, which can be seen in many cases other than demyelinating disease, including migraine headaches, early small vessel ischemic change and even in normal patients. 2. Mild cervical degenerative disc disease with mild left foraminal narrowing at C7-T1. 3. No cervical spinal cord abnormality. [CG]  2117 MR THORACIC SPINE WO CONTRAST [CG]  2117 MR LUMBAR SPINE WO CONTRAST IMPRESSION: MR THORACIC SPINE IMPRESSION  1. 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 A Schmorl's node at T10 is associated with mild marrow edema. 2. Multiple disc protrusions with focal mass effect where contacting the cord, as above. No cord edema or high-grade spinal stenosis.  MR LUMBAR SPINE IMPRESSION  1. No emergent finding or neural impingement. 2. 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 [CG]    Clinical Course User Index [CG] 2118, PA-C    Procedures  MDM   2140: MRIs personally visualized and interpreted.  Discussed with neurology Dr. 2141.  Does not recommend further emergent work-up, intervention.  Recommends outpatient follow-up.  Discussed with patient plan to discharge with short course of opiate pain medicine, muscle relaxer, steroid taper.  He is  requesting a work note until he can be seen by [facility] orthopedist.  States Dr. [facility] wanted him to be out until he is evaluated by [facility].  I will give him a work note for 1 week, recommended follow-up with PCP or outpatient clinic for any prolonged work excuse.  We will send oxycodone, Flexeril, Medrol pack to pharmacy.  Patient and wife in agreement with plan.      [facility], PA-C 05/15/20 2141    2142, MD 05/15/20 2330

## 2020-05-15 NOTE — ED Notes (Signed)
Patient transported to MRI 

## 2020-05-15 NOTE — ED Provider Notes (Signed)
[facility] EMERGENCY DEPARTMENT Provider Note   CSN: [id number] Arrival date & time: 05/14/20  2029     History Chief Complaint  Patient presents with  . Back Pain    Albert Watson is a 30 y.o. male with history of hypertension, GERD. Was transferred from [facility] received emergent MRI of lumbar and thoracic back.    Patient reports that he injured his back in late December 2021. Reports that since then he has been having intermittent thoracic and lumbar back pain. He also notes that his he was having intermittent numbness and tingling of his lower extremities. Patient reports that he is followed by Dr. Shon Baton in the outpatient setting.  Reports that over the last 4 days he has had in increase in his right second lumbar back pain. Pain has been constant, worse with movement. Reports that the pain does radiate into his buttocks. Patient also reports that he has had worsening numbness, tingling, and weakness to bilateral extremities. Reports that weakness is worse in his right lower extremity. Patient reports that yesterday he went 8 hours without having the urge to urinate despite drinking fluids regularly throughout the day. Around the 8-hour mark his wife encouraged him to try urination. Patient reports that it took him approximately 10 minutes with severe bearing down and pushing on his bladder to urinate.  Patient also reports that he has noticed new weakness in bilateral extremities over the past 4 days as well.    Patient reports that he has been able to urinate since being in the emergency department. He reports that his sensation to urinate has not returned to baseline.  Reports that will suddenly get the urge to urinate and then.  HPI     Past Medical History:  Diagnosis Date  . Asthma, chronic   . Dyspepsia   . Environmental allergies   . Familial combined hyperlipidemia   . GERD (gastroesophageal reflux disease)   . Goiter   . Hard of  hearing   . Horner's syndrome   . Hypertension   . Hypothyroidism, acquired, autoimmune   . Obesity (BMI 30-39.9)   . Status post surgery for complex congenital heart disease   . Thyroiditis, autoimmune     Patient Active Problem List   Diagnosis Date Noted  . Status post surgery for complex congenital heart disease   . Familial combined hyperlipidemia   . Goiter   . Thyroiditis, autoimmune   . Dyspepsia   . Hypothyroidism, acquired, autoimmune   . GERD (gastroesophageal reflux disease)   . Hypertension   . Asthma, chronic   . Environmental allergies   . Horner's syndrome   . Hard of hearing   . Obesity (BMI 30-39.9)   . Essential hypertension, benign 08/23/2010    Past Surgical History:  Procedure Laterality Date  . CARDIAC SURGERY    . complex congenital heart disease    . EXTERNAL EAR SURGERY    . KNEE SURGERY         Family History  Problem Relation Age of Onset  . Hyperlipidemia Mother        Increased cholesterol and triglycerides  . Diabetes Mother        T2 DM  . Obesity Mother   . Thyroid disease Father   . Hyperlipidemia Father        High cholesterol  . Heart disease Maternal Uncle        MI and CABG  . Heart disease Maternal Grandmother  CABG  . Hypertension Maternal Grandmother        T2 DM  . Heart disease Maternal Grandfather        CABG and MIs  . Cancer Maternal Grandfather        Skin  . Heart disease Paternal Grandfather        Died of MI    Social History   Tobacco Use  . Smoking status: Never Smoker  . Smokeless tobacco: Current User    Types: Snuff  Substance Use Topics  . Alcohol use: No  . Drug use: No    Home Medications Prior to Admission medications   Medication Sig Start Date End Date Taking? Authorizing Provider  cetirizine (ZYRTEC ALLERGY) 10 MG tablet Take 1 tablet (10 mg total) by mouth daily. 07/25/17   Georgiana Shore, PA-C  doxycycline (VIBRAMYCIN) 100 MG capsule Take 1 capsule (100 mg total) by  mouth 2 (two) times daily. Patient not taking: Reported on 07/24/2017 04/29/17   Ivery Quale, PA-C  ELDERBERRY PO Take 1 capsule by mouth daily.    [provider]  guaifenesin (ROBITUSSIN) 100 MG/5ML syrup Take 5-10 mLs (100-200 mg total) by mouth every 4 (four) hours as needed for cough. 07/25/17   Georgiana Shore, PA-C  HYDROcodone-acetaminophen (NORCO/VICODIN) 5-325 MG tablet Take 2 tablets by mouth every 4 (four) hours as needed. 03/25/20   Rolan Bucco, MD  levothyroxine (SYNTHROID, LEVOTHROID) 125 MCG tablet TAKE ONE TABLET BY MOUTH 6 DAYS A WEEK (MON-SAT) AND ONE-HALF TABLET BY MOUTH ON SUN 04/01/11   David Stall, MD  lisinopril (PRINIVIL,ZESTRIL) 10 MG tablet Take 1 tablet lisinopril 10 mg in the morning and 1/2 tablet 5 mg in the evening. 01/05/13   David Stall, MD  loratadine-pseudoephedrine (CLARITIN-D 12 HOUR) 5-120 MG tablet Take 1 tablet by mouth 2 (two) times daily. 04/29/17   Ivery Quale, PA-C  LORazepam (ATIVAN) 0.5 MG tablet Take 0.5 mg by mouth every 8 (eight) hours.    [provider]  ondansetron (ZOFRAN ODT) 4 MG disintegrating tablet Take 1 tablet (4 mg total) by mouth every 8 (eight) hours as needed for nausea or vomiting. 07/25/17   Mathews Robinsons B, PA-C  ranitidine (ZANTAC) 150 MG tablet Take 150 mg by mouth 2 (two) times daily as needed for heartburn.    [provider]  sodium chloride (OCEAN) 0.65 % SOLN nasal spray Place 1 spray into both nostrils as needed for congestion. 07/25/17   Georgiana Shore, PA-C    Allergies    Azithromycin, Codeine, Septra [bactrim], and Sulfa antibiotics  Review of Systems   Review of Systems  Constitutional: Negative for chills and fever.  Eyes: Negative for visual disturbance.  Respiratory: Negative for shortness of breath.   Cardiovascular: Negative for chest pain.  Gastrointestinal: Negative for abdominal pain, nausea and vomiting.  Genitourinary: Positive for difficulty  urinating. Negative for dysuria.  Musculoskeletal: Positive for back pain. Negative for neck pain.  Skin: Negative for color change and rash.  Neurological: Negative for dizziness, syncope, light-headedness and headaches.  Psychiatric/Behavioral: Negative for confusion.    Physical Exam Updated Vital Signs BP (!) 175/93   Pulse (!) 110   Temp 98.1 F (36.7 C)   Resp (!) 21   Ht 5\' 7"  (1.702 m)   Wt 97.5 kg   SpO2 100%   BMI 33.67 kg/m   Physical Exam Vitals and nursing note reviewed.  Constitutional:      General: He is not  in acute distress.    Appearance: He is not ill-appearing, toxic-appearing or diaphoretic.     Comments: Patient is in discomfort due to back pain  HENT:     Head: Normocephalic and atraumatic.  Eyes:     General: No scleral icterus.       Right eye: No discharge.        Left eye: No discharge.     Extraocular Movements: Extraocular movements intact.     Pupils: Pupils are equal, round, and reactive to light.  Cardiovascular:     Rate and Rhythm: Normal rate.  Pulmonary:     Effort: Pulmonary effort is normal. No respiratory distress.  Abdominal:     General: There is no distension.     Palpations: Abdomen is soft. There is no mass or pulsatile mass.     Tenderness: There is no abdominal tenderness. There is no guarding or rebound.  Musculoskeletal:     Cervical back: Normal range of motion and neck supple. No deformity, rigidity, tenderness or bony tenderness.     Thoracic back: Tenderness and bony tenderness present. No deformity.     Lumbar back: Tenderness and bony tenderness present. No deformity.     Right lower leg: No swelling, deformity, lacerations, tenderness or bony tenderness. No edema.     Left lower leg: No swelling, deformity, lacerations, tenderness or bony tenderness. No edema.     Comments: Tenderness to thoracic spine and lumbar spine extending to paraspinal muscles  Skin:    General: Skin is warm and dry.     Coloration: Skin  is not jaundiced or pale.     Findings: No rash.  Neurological:     General: No focal deficit present.     Mental Status: He is alert.     GCS: GCS eye subscore is 4. GCS verbal subscore is 5. GCS motor subscore is 6.     Cranial Nerves: No cranial nerve deficit or facial asymmetry.     Sensory: Sensation is intact.     Motor: No weakness, tremor, seizure activity or pronator drift.     Coordination: Romberg sign negative. Finger-Nose-Finger Test normal.     Gait: Gait is intact.     Comments: CN II-XII intact, equal grip strength, +5 strength to bilateral upper extremities, right lower extremity 4/5, left lower extremity 5/5  Patient reports decreased sensation to L3, L4, L5, S1 dermatomes bilaterally  Antalgic gait    Psychiatric:        Behavior: Behavior is cooperative.     ED Results / Procedures / Treatments   Labs (all labs ordered are listed, but only abnormal results are displayed) Labs Reviewed - No data to display  EKG None  Radiology MR THORACIC SPINE WO CONTRAST  Result Date: 05/15/2020 CLINICAL DATA:  Low back pain with progressive neurologic deficit. Heaviness of both legs with tingling and decreased sensation. EXAM: MRI THORACIC AND LUMBAR SPINE WITHOUT CONTRAST TECHNIQUE: Multiplanar and multiecho pulse sequences of the thoracic and lumbar spine were obtained without intravenous contrast. COMPARISON:  None. FINDINGS: MRI THORACIC SPINE FINDINGS Alignment:  Negative for listhesis. Vertebrae: Superior endplate Schmorl's node at T10 with adjacent marrow edematous signal that is mild. T10 superior endplate fracture deformity with adjacent fatty marrow conversion. Remote superior endplate fracture of T6 with fatty marrow conversion. No evidence of discitis or aggressive bone lesion. Cord:  Normal signal and morphology. Paraspinal and other soft tissues: Right hepatic cyst. Right aortic arch. Disc levels: T5-6 small right  paracentral protrusion. T6-7 right paracentral  protrusion with upward migration and mild flattening of the right cord. T7-8 right paracentral disc protrusion with upward pointing, contacting and mildly flattening the right cord. T8-9 central to left paracentral protrusion with focal mass effect on the cord. T11-12 upward pointing left paracentral protrusion contacting but not compressing the cord. No noted facet arthritis or foraminal impingement. MRI LUMBAR SPINE FINDINGS Segmentation:  5 lumbar type vertebrae Alignment:  Borderline retrolisthesis at L3-4 and L4-5. Vertebrae:  No fracture, evidence of discitis, or bone lesion. Conus medullaris and cauda equina: Conus extends to the L1 level. Conus and cauda equina appear normal. Paraspinal and other soft tissues: Expected postoperative scarring on the right and posteriorly at L4-5. Disc levels: T12- L1: Unremarkable. L1-L2: Unremarkable. L2-L3: Unremarkable. L3-L4: Disc desiccation and narrowing with mild bulge. No impingement L4-L5: Disc desiccation and narrowing with right paracentral annular fissure and protrusion at a micro discectomy site. No compressive stenosis. L5-S1:Mild facet spurring. Conjoined L5 and S1 root sleeves on the right. IMPRESSION: MR THORACIC SPINE IMPRESSION 1. 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 A Schmorl's node at T10 is associated with mild marrow edema. 2. Multiple disc protrusions with focal mass effect where contacting the cord, as above. No cord edema or high-grade spinal stenosis. MR LUMBAR SPINE IMPRESSION 1. No emergent finding or neural impingement. 2. 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 Electronically Signed   By: Marnee Spring M.D.   On: 05/15/2020 04:43   MR LUMBAR SPINE WO CONTRAST  Result Date: 05/15/2020 CLINICAL DATA:  Low back pain with progressive neurologic deficit. Heaviness of both legs with tingling and decreased sensation. EXAM: MRI THORACIC AND LUMBAR SPINE WITHOUT CONTRAST TECHNIQUE: Multiplanar and multiecho pulse  sequences of the thoracic and lumbar spine were obtained without intravenous contrast. COMPARISON:  None. FINDINGS: MRI THORACIC SPINE FINDINGS Alignment:  Negative for listhesis. Vertebrae: Superior endplate Schmorl's node at T10 with adjacent marrow edematous signal that is mild. T10 superior endplate fracture deformity with adjacent fatty marrow conversion. Remote superior endplate fracture of T6 with fatty marrow conversion. No evidence of discitis or aggressive bone lesion. Cord:  Normal signal and morphology. Paraspinal and other soft tissues: Right hepatic cyst. Right aortic arch. Disc levels: T5-6 small right paracentral protrusion. T6-7 right paracentral protrusion with upward migration and mild flattening of the right cord. T7-8 right paracentral disc protrusion with upward pointing, contacting and mildly flattening the right cord. T8-9 central to left paracentral protrusion with focal mass effect on the cord. T11-12 upward pointing left paracentral protrusion contacting but not compressing the cord. No noted facet arthritis or foraminal impingement. MRI LUMBAR SPINE FINDINGS Segmentation:  5 lumbar type vertebrae Alignment:  Borderline retrolisthesis at L3-4 and L4-5. Vertebrae:  No fracture, evidence of discitis, or bone lesion. Conus medullaris and cauda equina: Conus extends to the L1 level. Conus and cauda equina appear normal. Paraspinal and other soft tissues: Expected postoperative scarring on the right and posteriorly at L4-5. Disc levels: T12- L1: Unremarkable. L1-L2: Unremarkable. L2-L3: Unremarkable. L3-L4: Disc desiccation and narrowing with mild bulge. No impingement L4-L5: Disc desiccation and narrowing with right paracentral annular fissure and protrusion at a micro discectomy site. No compressive stenosis. L5-S1:Mild facet spurring. Conjoined L5 and S1 root sleeves on the right. IMPRESSION: MR THORACIC SPINE IMPRESSION 1. 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 A Schmorl's node  at T10 is associated with mild marrow edema. 2. Multiple disc protrusions with focal mass effect where contacting the cord,  as above. No cord edema or high-grade spinal stenosis. MR LUMBAR SPINE IMPRESSION 1. No emergent finding or neural impingement. 2. 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 Electronically Signed   By: Marnee SpringJonathon  Watts M.D.   On: 05/15/2020 04:43    Procedures Procedures   Medications Ordered in ED Medications  LORazepam (ATIVAN) tablet 1 mg (has no administration in time range)  sodium chloride 0.9 % bolus 1,000 mL (has no administration in time range)  DULoxetine (CYMBALTA) DR capsule 90 mg (has no administration in time range)  LORazepam (ATIVAN) injection 1 mg (1 mg Intravenous Given 05/15/20 1839)  oxyCODONE-acetaminophen (PERCOCET/ROXICET) 5-325 MG per tablet 1 tablet (1 tablet Oral Given 05/14/20 2316)  dexamethasone (DECADRON) injection 10 mg (10 mg Intramuscular Given 05/14/20 2317)  oxyCODONE-acetaminophen (PERCOCET/ROXICET) 5-325 MG per tablet 1 tablet (1 tablet Oral Given 05/15/20 1034)  oxyCODONE-acetaminophen (PERCOCET/ROXICET) 5-325 MG per tablet 1 tablet (1 tablet Oral Given 05/15/20 1405)  lisinopril (ZESTRIL) tablet 15 mg (15 mg Oral Given 05/15/20 1704)  gabapentin (NEURONTIN) capsule 300 mg (300 mg Oral Given 05/15/20 1704)  ondansetron (ZOFRAN) injection 4 mg (4 mg Intravenous Given 05/15/20 1717)  fentaNYL (SUBLIMAZE) injection 50 mcg (50 mcg Intravenous Given 05/15/20 1716)  gadobutrol (GADAVIST) 1 MMOL/ML injection 10 mL (10 mLs Intravenous Contrast Given 05/15/20 1950)    ED Course  I have reviewed the triage vital signs and the nursing notes.  Pertinent labs & imaging results that were available during my care of the patient were reviewed by me and considered in my medical decision making (see chart for details).  Clinical Course as of 05/15/20 1855  Tue May 15, 2020  1849 Low back pain +tingling urinary retention yesterday Seen by  neurology here in ER   Neurosurgery seen in ER No surgical intervention  Pending MRI ?MS  On opioid pain medicines, gabapentin Consider admission for pain management  PT evaluated and recommended a cane  Anticipate discharge  [CG]    Clinical Course User Index [CG] Jerrell MylarGibbons, Claudia J, PA-C   MDM Rules/Calculators/A&P                          Alert 30 year old male in discomfort from his back pain.  Patient was lost he was going for MRI of thoracic and lumbar back.  [facility]  Patient presents with urinary retention as well as worsening back pain, bilateral lower leg weakness, bilateral lower leg numbness and tingling.  He also reports new weakness to bilateral upper extremities has developed over the last 4 days.    Patient suffered back injury in late December 2021 while at work.  Patient has been seen by Dr. Shon BatonBrooks of Ortho surgery in the outpatient setting.  Previous MRI of lumbar and thoracic back have been ordered.  On physical exam patient has CN II-XII intact, equal grip strength, +5 strength to bilateral upper extremities, 4/5 strength to lower right extremity, 5/5 strength to lower left extremity.  Patient reports decrease sensation to L3, L4, L5, S1 dermatomes bilaterally.  Is able to stand and walk unassisted.  Patient noted to have antalgic gait.  Abdomen soft, nondistended, no tenderness, no tenderness to suprapubic area.  MRI thoracic spine shows 1) chronic superior endplate fractures at T6 and T10. A Schmorl's node at T10 is associated with mild marrow edema. 2)Multiple disc protrusions with focal mass effect where contacting the cord, as above. No cord edema or high-grade spinal  stenosis.  MRI Lumbar spine showed: 1)No emergent finding or neural impingement. 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  Nanavati spoke with  on-call physician.    13:17 spoke with Dr. Shon Baton who reached out to Dr. Maisie Fus.  Both physicians have reviewed the films and feel that there is no surgical follow-up needed at this time.  He recommended consulting neurology to consult for patient's reports of new of upper extremity weakness.    14:23 I spoke with Dr.Absher who will come to see the patient.   After dilating the patient Dr. Napoleon Form recommended an MRI of brain and cervical spine with and without contrast evaluate evaluate for possible causes of patient's new and worsening symptoms.  He recommended this could be done in the outpatient setting if necessary.  15:48 I spoke with Dr. Shon Baton who will come to evaluate the patient in person.   Dr. Shon Baton examined the patient recommends discharge with follow-up if his pain is controlled.  If pain is uncontrolled he recommends bringing the patient in for pain management and physical therapy.  Patient was given additional oxycodone to help with pain management.  Patient was ordered his home lisinopril, gabapentin.  Patient was given additional fentanyl injection.    Spoke with patient's wife as he was in MRI for discussion with Dr. Shon Baton and plan moving forward.  Patient will receive 2 to 3 days of opiate pain medication and steroid burst to help with his pain.  Patient is to follow-up with Dr. Shon Baton in the outpatient setting if needed.  Patient was also referred to her [facility] at [facility] by Dr. Shon Baton.    Patient care transferred to PA Gibbons at the end of my shift. Patient presentation, ED course, and plan of care discussed with review of all pertinent labs and imaging. Please see his/her note for further details regarding further ED course and disposition.       Final Clinical Impression(s) / ED Diagnoses Final diagnoses:  Difficulty urinating  Herniation of thoracolumbar intervertebral disc  Decreased sensation of leg    Rx / DC Orders ED Discharge Orders    None        Haskel Schroeder, PA-C 05/15/20 2313    Derwood Kaplan, MD 05/16/20 [phone]

## 2020-05-16 NOTE — ED Notes (Signed)
DC instructions reviewed with pt. Pt verbalized understanding.  Pt DC 

## 2020-10-04 ENCOUNTER — Telehealth: Payer: Self-pay

## 2020-10-04 NOTE — Telephone Encounter (Signed)
Telephone Call/Scheduled Initial Visit SW completed call with patient to scheduled an initial palliative care visit with him. Visit scheduled for 10/08/20@12  pm.

## 2020-10-08 ENCOUNTER — Other Ambulatory Visit: Payer: Self-pay

## 2020-10-08 ENCOUNTER — Other Ambulatory Visit: Payer: 59 | Admitting: *Deleted

## 2020-10-08 ENCOUNTER — Other Ambulatory Visit: Payer: 59

## 2020-10-08 VITALS — BP 137/108 | HR 92 | Temp 97.7°F | Resp 18

## 2020-10-08 DIAGNOSIS — Z515 Encounter for palliative care: Secondary | ICD-10-CM

## 2020-10-08 NOTE — Progress Notes (Signed)
COMMUNITY PALLIATIVE CARE SW NOTE  PATIENT NAME: Albert Watson DOB: 08-05-1990 MRN: [id number]  PRIMARY CARE PROVIDER: Patient, No Pcp Per (Inactive)  RESPONSIBLE PARTY:  Acct ID - Guarantor Home Phone Work Phone Relationship Acct Type  [id number] NTHONY, Watson* [phone]  Self P/F     [address], [place], [place] [address]     PLAN OF CARE and INTERVENTIONS:             GOALS OF CARE/ ADVANCE CARE PLANNING:  (1) Goal is for patient to get his pain under control to have a better quality of life, and (2) to be able to do things with his children without  pain.  SOCIAL/EMOTIONAL/SPIRITUAL ASSESSMENT/ INTERVENTIONS:  SW and RN- M. Howard completed and initial visit with patient at his home. He was present with his wife and three children. The team explained/educated patient and his wife on palliative care services, role in his care and visit frequency. Patient and his wife provided verbal consent to services and was left a folder of information for their review and for contact information. Patient provided a status update and social history on himself. STATUS: patient reported that since his back injury, he has suffered depression, urinary retention issues, weakness, poor appetite, intermittent numbing in his left fingers, falls, and constant pain to his back, shoulders, chest and legs. Patient report that his current pain regiment take the edge off his pain, but he remains in constant pain. SW discussed patient's depression. Patient reported depression related to his illness, but also related to grief over the loss of his parents. Patient declined a referral for counseling as he receives pastoral counseling from a long-time family friend. Patient requested assistance with obtaining physical therapy and DME (electric scooter and transport wheelchair). SW provided education, active and reflective listening, resources and reassurance of support while assessing his needs and comfort. Team to  provide ongoing support to patient and his wife as his status/needs indicate. SOCIAL HISTORY: Patient completed his GED and attended a few years of college. 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 Patient has been married for 8 years and have three children. His in-laws have been supportive to the family. His wife works full-time as a Materials engineer.  PATIENT/CAREGIVER EDUCATION/ COPING:  Patient report depression and is receiving support from a long-time family friend. PERSONAL EMERGENCY PLAN:  911 can be activated for emergencies. COMMUNITY RESOURCES COORDINATION/ HEALTH CARE NAVIGATION: None. FINANCIAL/LEGAL CONCERNS/INTERVENTIONS:  None.     SOCIAL HX:  Social History   Tobacco Use   Smoking status: Never   Smokeless tobacco: Current    Types: Snuff  Substance Use Topics   Alcohol use: No    CODE STATUS: Full Code ADVANCED DIRECTIVES: No MOST FORM COMPLETE:  No HOSPICE EDUCATION PROVIDED: No  PPS: Patient is alert and oriented x3. He is experiencing constant pain.   Duration of visit and documentation: 60 minutes      Clydia Llano, LCSW

## 2020-10-09 NOTE — Progress Notes (Signed)
[facility] RN NOTE  PATIENT NAME: Albert Watson DOB: [birth year] MRN: [id number]  PRIMARY CARE PROVIDER: Patient, No Pcp Per (Inactive)  RESPONSIBLE PARTY:  Acct ID - Guarantor Home Phone Work Phone Relationship Acct Type  [id number] [name], Watson* [phone]  Self P/F     [name], [name], [place] [address]   Covid-19 Pre-screening Negative  PLAN OF CARE and INTERVENTION:  ADVANCE CARE PLANNING/GOALS OF CARE: Goal is for patient to have improved pain relief for a better quality of life. He wants to be able to do more with his 3 children and assist his wife around the home. He is a Full code. PATIENT/CAREGIVER EDUCATION: Explained Palliative care services, symptom management, pain management, safe mobility, fall prevention DISEASE STATUS: Joint initial palliative care assessment visit completed with MSW, [facility]. Met with patient and his wife in their home. Upon arrival, patient is sitting up in his recliner awake and alert rocking his baby son. He is experiencing pain in his mid back region which radiates up to his shoulders and towards right chest region. He has a history of a laminectomy to his lower back. 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 He speaks of the incident leading up to his more severe back issues. He was lifting up a casket into a hearse when he suddenly felt a severe pain in his back. He has had several scans which shows extensive issues with his spine including compression fractures and bulging disks per wife. Wife reports that patient has been referred to a Spine Clinic in [facility] but was told he has extensive damage in his spine and that surgery is not worth the risk. He says he is supposed to be referred to a pain clinic and for PT but has not heard anything regarding this as of yet. His NP Chasity Phylliss Bob is currently managing his pain medication for the time being. He is  currently taking MS Contin 30 mg in am and pm and 15 mg mid day. He is also on Lyrica75 mg TID. He also has Oxycodone 15 mg for breakthrough pain every 4-6 hours as needed. He is averaging about 4 of these tablets per day. He feels that his current medication regimen does take the edge off, but is always present. He says he is only getting about 3 hours of sleep per night and takes cat naps during the day. He is ambulatory, however his legs go numb all of a sudden causing him to fall. He has had 4-5 falls over the past 2 months. His often has leg numbness which also occurs at times in his right fingers. He requires assistance with bathing, but does have a shower chair. He feels most comfortable sitting or lying down. He was able to go to [employer] recently with his family, but had to use their electric scooter to travel around the park. His wife is requesting DME; lightweight wheelchair, transport wheelchair, motorized chair and he said he was agreeable to trying a rollator walker. MSW to look into these items from a community resource. He doesn't have much of an appetite. He has not had any breakfast or lunch so far today. Last night for dinner he ate 1/3 of a sub sandwich. Mainly only taking in bites of food due to pain. He reports having a history of depression and panic attacks which has heightened since this has happened with his back. He is on Cymbalta and Buspar.  He also has Atarax available which he rarely uses. He is taking Senna Plus to help with constipation. He is also concerned due to issues with urination. It takes him 2-3 minutes before being able to start urinating. I will speak with our Palliative care MD to discuss symptom management needs. They are agreeable to receive ongoing services from palliative care.    HISTORY OF PRESENT ILLNESS: This is a 30 yo male with a history of complex congenital heart disease, Horner's syndrome, HTN, depression, anxiety, GERD and hypothyroidism. Palliative  care referral was made to assist with symptom management needs.   CODE STATUS: Full code ADVANCED DIRECTIVES: N MOST FORM: no PPS: 50%   PHYSICAL EXAM:   VITALS: Today's Vitals   10/08/20 1235  BP: (!) 137/108  Pulse: 92  Resp: 18  Temp: 97.7 F (36.5 C)  TempSrc: Temporal  SpO2: 98%  PainSc: 8   PainLoc: Back    LUNGS: clear to auscultation  CARDIAC: Cor RRR EXTREMITIES: No edema SKIN:  Scrape on right knee from recent fall   NEURO:  Alert and oriented x 3, generalized weakness, unsteady gait, ambulatory   (Duration of visit and documentation 75 minutes)   Daryl Eastern, RN BSN

## 2020-10-12 ENCOUNTER — Other Ambulatory Visit: Payer: 59

## 2020-10-13 NOTE — Progress Notes (Signed)
Palliative Care Social Work Encounter Prior to arriving at [employer] home, SW returned a call to patient as he called into the call center and requested with RN or SW follow-up. Patient reported that he was having a lot of paint to the point that he felt nauseated. SW explained to patient that the plan was to have the palliative MD follow-up with him next week. SW further explained to him that the RN was out of the office today and if he was in that much pain, SW advised him to go to the emergency department. Patient stated that he was doing a little better and felt that he could wait until next week. He also stated that if pain returned he will consider going to the emergency department.  SW later dropped off a donated Rolator to patient at his home.  SW will update palliative RN/physician for follow-up.

## 2020-11-07 ENCOUNTER — Telehealth: Payer: Self-pay

## 2020-11-07 NOTE — Telephone Encounter (Signed)
(  10:35 am) Palliative care SW returned a call to patient. He was inquiring about the PT/OT consult. SW advised him that the nurse navigator is working on this and she will have her follow-up with him. He also requested assistance with getting counseling. SW advised him that she will follow-up along with getting him a wheelchair.   *Nurse navigator-B. Senor updated for follow-up with patient.

## 2021-02-12 ENCOUNTER — Encounter (HOSPITAL_COMMUNITY): Payer: Self-pay

## 2021-02-12 ENCOUNTER — Emergency Department (HOSPITAL_COMMUNITY)
Admission: EM | Admit: 2021-02-12 | Discharge: 2021-02-12 | Disposition: A | Discharge status: Home / Self Care | Payer: Worker's Compensation | Attending: Emergency Medicine | Admitting: Emergency Medicine

## 2021-02-12 ENCOUNTER — Emergency Department (HOSPITAL_COMMUNITY): Payer: Worker's Compensation

## 2021-02-12 ENCOUNTER — Other Ambulatory Visit: Payer: Self-pay

## 2021-02-12 DIAGNOSIS — F17220 Nicotine dependence, chewing tobacco, uncomplicated: Secondary | ICD-10-CM | POA: Diagnosis not present

## 2021-02-12 DIAGNOSIS — E039 Hypothyroidism, unspecified: Secondary | ICD-10-CM | POA: Diagnosis not present

## 2021-02-12 DIAGNOSIS — M25561 Pain in right knee: Secondary | ICD-10-CM | POA: Insufficient documentation

## 2021-02-12 DIAGNOSIS — W19XXXA Unspecified fall, initial encounter: Secondary | ICD-10-CM

## 2021-02-12 DIAGNOSIS — J45909 Unspecified asthma, uncomplicated: Secondary | ICD-10-CM | POA: Insufficient documentation

## 2021-02-12 DIAGNOSIS — Y990 Civilian activity done for income or pay: Secondary | ICD-10-CM | POA: Diagnosis not present

## 2021-02-12 DIAGNOSIS — Y92009 Unspecified place in unspecified non-institutional (private) residence as the place of occurrence of the external cause: Secondary | ICD-10-CM | POA: Diagnosis not present

## 2021-02-12 DIAGNOSIS — M25551 Pain in right hip: Secondary | ICD-10-CM | POA: Diagnosis not present

## 2021-02-12 DIAGNOSIS — Y9301 Activity, walking, marching and hiking: Secondary | ICD-10-CM | POA: Diagnosis not present

## 2021-02-12 DIAGNOSIS — W1839XA Other fall on same level, initial encounter: Secondary | ICD-10-CM | POA: Diagnosis not present

## 2021-02-12 DIAGNOSIS — R0789 Other chest pain: Secondary | ICD-10-CM | POA: Insufficient documentation

## 2021-02-12 DIAGNOSIS — I10 Essential (primary) hypertension: Secondary | ICD-10-CM | POA: Insufficient documentation

## 2021-02-12 MED ORDER — KETOROLAC TROMETHAMINE 30 MG/ML IJ SOLN
30.0000 mg | Freq: Once | INTRAMUSCULAR | Status: AC
  Administered 2021-02-12: 30 mg via INTRAMUSCULAR
  Filled 2021-02-12: qty 1

## 2021-02-12 MED ORDER — OXYCODONE HCL 5 MG PO TABS
10.0000 mg | ORAL_TABLET | Freq: Once | ORAL | Status: AC
  Administered 2021-02-12: 10 mg via ORAL
  Filled 2021-02-12: qty 2

## 2021-02-12 NOTE — ED Triage Notes (Addendum)
Pt presents with c/o fall that occurred earlier today. Pt reports he has a hx of multiple back problems, herniated discs. Pt reports pain on the right side of his body.

## 2021-02-12 NOTE — ED Provider Notes (Signed)
Emergency Department Provider Note   I have reviewed the triage vital signs and the nursing notes.   HISTORY  Chief Complaint Fall   HPI Albert Watson is a 30 y.o. male with past history reviewed below including chronic thoracic and lumbar spine pain presents to the emergency department after a mechanical fall.  Patient states he was walking at home when his legs "gave out" causing him to fall to the ground mainly on his right side.  He denies head injury or loss of consciousness.  He is not having headache or neck pain.  He has had progressive pain in his back along with subjective weakness over the past year.  He tells me that his initial injury was related to [employer] and he has had issues getting outpatient NSG referral and MRI. His last MRI was in February. He denies any new weakness/numbness since falling today. No groin numbness. No urinary issues. The main location of his pain today is right knee, right chest, and his chronic back pain. He does take methadone and additional opiate medication for breakthrough pain. He is followed by a pain mgmt clinic.   Past Medical History:  Diagnosis Date   Asthma, chronic    Dyspepsia    Environmental allergies    Familial combined hyperlipidemia    GERD (gastroesophageal reflux disease)    Goiter    Hard of hearing    Horner's syndrome    Hypertension    Hypothyroidism, acquired, autoimmune    Obesity (BMI 30-39.9)    Status post surgery for complex congenital heart disease    Thyroiditis, autoimmune     Patient Active Problem List   Diagnosis Date Noted   Status post surgery for complex congenital heart disease    Familial combined hyperlipidemia    Goiter    Thyroiditis, autoimmune    Dyspepsia    Hypothyroidism, acquired, autoimmune    GERD (gastroesophageal reflux disease)    Hypertension    Asthma, chronic    Environmental allergies    Horner's syndrome    Hard of hearing    Obesity (BMI 30-39.9)     Essential hypertension, benign 08/23/2010    Past Surgical History:  Procedure Laterality Date   CARDIAC SURGERY     complex congenital heart disease     EXTERNAL EAR SURGERY     KNEE SURGERY      Allergies Azithromycin, Codeine, Septra [bactrim], and Sulfa antibiotics  Family History  Problem Relation Age of Onset   Hyperlipidemia Mother        Increased cholesterol and triglycerides   Diabetes Mother        T2 DM   Obesity Mother    Thyroid disease Father    Hyperlipidemia Father        High cholesterol   Heart disease Maternal Uncle        MI and CABG   Heart disease Maternal Grandmother        CABG   Hypertension Maternal Grandmother        T2 DM   Heart disease Maternal Grandfather        CABG and MIs   Cancer Maternal Grandfather        Skin   Heart disease Paternal Grandfather        Died of MI    Social History Social History   Tobacco Use   Smoking status: Never   Smokeless tobacco: Current    Types: Snuff  Substance Use Topics  Alcohol use: No   Drug use: No    Review of Systems  Constitutional: No fever/chills Eyes: No visual changes. ENT: No sore throat. Cardiovascular: Denies chest pain. Respiratory: Denies shortness of breath. Gastrointestinal: No abdominal pain.  No nausea, no vomiting.  No diarrhea.  No constipation. Genitourinary: Negative for dysuria. Musculoskeletal: Positive for back pain with right knee and chest wall pain.  Skin: Negative for rash. Neurological: Negative for headaches, focal weakness or numbness.  10-point ROS otherwise negative.  ____________________________________________   PHYSICAL EXAM:  VITAL SIGNS: ED Triage Vitals  Enc Vitals Group     BP 02/12/21 1833 134/85     Pulse Rate 02/12/21 1833 (!) 108     Resp 02/12/21 1833 18     Temp 02/12/21 1833 98.1 F (36.7 C)     Temp Source 02/12/21 1833 Oral     SpO2 02/12/21 1833 100 %     Weight 02/12/21 2001 225 lb (102.1 kg)     Height 02/12/21 2001  5\' 7"  (1.702 m)   Constitutional: Alert and oriented. Well appearing and in no acute distress. Eyes: Conjunctivae are normal.  Head: Atraumatic. Nose: No congestion/rhinnorhea. Mouth/Throat: Mucous membranes are moist.  Oropharynx non-erythematous. Neck: No stridor.  No cervical spine tenderness to palpation. Cardiovascular: Normal rate, regular rhythm. Good peripheral circulation. Grossly normal heart sounds.   Respiratory: Normal respiratory effort.  No retractions. Lungs CTAB. Gastrointestinal: Soft and nontender. No distention.  Musculoskeletal: No lower extremity tenderness nor edema. No gross deformities of extremities. Neurologic:  Normal speech and language. 5/5 strength in the bilateral upper/lower extremities. Normal sensation. 2+ patellar reflexes. No clonus.  Skin:  Skin is warm, dry and intact. No rash noted.  ____________________________________________  RADIOLOGY  DG Ribs Unilateral W/Chest Right  Result Date: 02/12/2021 CLINICAL DATA:  Recent fall with right-sided chest pain, initial encounter EXAM: RIGHT RIBS AND CHEST - 3+ VIEW COMPARISON:  07/24/2017 FINDINGS: Cardiac shadow is mildly prominent but stable. Postsurgical changes are again seen. The lungs are well aerated without focal infiltrate, effusion or pneumothorax. No acute rib fractures are seen. Old deformity of the right fourth and fifth ribs is noted stable in appearance from the prior exam. This may be related to prior surgery. IMPRESSION: No evidence of acute rib fracture or pneumothorax. Mild chronic appearing deformity of the right fourth and fifth ribs which may be related to the known prior cardiac surgery. Electronically Signed   By: 07/26/2017 M.D.   On: 02/12/2021 19:58   DG Knee Complete 4 Views Right  Result Date: 02/12/2021 CLINICAL DATA:  Recent fall with right knee pain, initial encounter EXAM: RIGHT KNEE - COMPLETE 4+ VIEW COMPARISON:  None. FINDINGS: Very minimal medial joint space narrowing is  noted. No acute fracture or dislocation is noted. No joint effusion is seen. IMPRESSION: Degenerative change without acute abnormality. Electronically Signed   By: 02/14/2021 M.D.   On: 02/12/2021 19:55   DG Hip Unilat With Pelvis 2-3 Views Right  Result Date: 02/12/2021 CLINICAL DATA:  Recent fall with right hip pain, initial encounter EXAM: DG HIP (WITH OR WITHOUT PELVIS) 3V RIGHT COMPARISON:  None. FINDINGS: Pelvic ring is intact. No acute fracture or dislocation is seen. No soft tissue abnormality is noted. Mild degenerative changes of the hip joints are noted bilaterally. IMPRESSION: Mild degenerative change without acute abnormality. Electronically Signed   By: 02/14/2021 M.D.   On: 02/12/2021 19:56    ____________________________________________   PROCEDURES  Procedure(s) performed:  Procedures  None  ____________________________________________   INITIAL IMPRESSION / ASSESSMENT AND PLAN / ED COURSE  Pertinent labs & imaging results that were available during my care of the patient were reviewed by me and considered in my medical decision making (see chart for details).   Patient presents emergency department with acute on chronic back pain along with knee pain and right chest pain after mechanical fall at home.  Over the past year he has had subjectively worsening pain symptoms.  Not appreciate any weakness, numbness, clonus, reflex abnormality to prompt emergent MRI imaging in the emergency department.  Plain films obtained during the MSE process were reviewed with no acute findings. Plan for pain medication here and discharge. He has been referred to [facility] to see Dr. Yetta Barre. I have attached the contact information on the AVS. Patient's wife is here to drive him home.   Differential diagnosis includes but is not exclusive to musculoskeletal back pain, renal colic, urinary tract infection, pyelonephritis, intra-abdominal causes of back pain, aortic aneurysm or  dissection, cauda equina syndrome, sciatica, lumbar disc disease, thoracic disc disease, etc.  ____________________________________________  FINAL CLINICAL IMPRESSION(S) / ED DIAGNOSES  Final diagnoses:  Fall, initial encounter  Acute pain of right knee  Chest wall pain     MEDICATIONS GIVEN DURING THIS VISIT:  Medications  oxyCODONE (Oxy IR/ROXICODONE) immediate release tablet 10 mg (has no administration in time range)  ketorolac (TORADOL) 30 MG/ML injection 30 mg (30 mg Intramuscular Given 02/12/21 1914)    Note:  This document was prepared using Dragon voice recognition software and may include unintentional dictation errors.  Alona Bene, MD, [facility] Emergency Medicine    Riggins Cisek, Arlyss Repress, MD 02/12/21 2032

## 2021-02-12 NOTE — ED Provider Notes (Signed)
Emergency Medicine Provider Triage Evaluation Note  Albert FILLEY , a 30 y.o. male  was evaluated in triage.  Pt complains of pain in the right side following a fall.  Has a history of" herniated" and" desiccated" discs in his thoracic and lumbar spine respectively.  He has no orthopedic surgeon, nor neurosurgeon with whom he follows.  He does go to a pain clinic.  He typically takes methadone and oxycodone for pain control, chronic. Today and mechanical fall, onto his right side.  Since that time he has had severe pain in the right side.  He has been ambulatory, notes the pain is worse with ambulation.  Sharp severe primarily in the right knee, right rib cage.  Review of Systems  Positive: As above Negative: Syncope, loss of sensation, new weakness  Physical Exam  BP 134/85   Pulse (!) 108   Temp 98.1 F (36.7 C) (Oral)   Resp 18   SpO2 100%  Gen:   Awake, no distress speaking clearly Resp:  Normal effort  MSK:   Moves extremities without difficulty can flex each hip independently, to command, moves all extremity spontaneously including his right arm which she says hurts primarily. Other:  Neurologically unremarkable  Medical Decision Making  Medically screening exam initiated at 6:36 PM.  Appropriate orders placed.  Tennis Ship was informed that the remainder of the evaluation will be completed by another provider, this initial triage assessment does not replace that evaluation, and the importance of remaining in the ED until their evaluation is complete.   Gerhard Munch, MD 02/12/21 [phone]

## 2021-02-12 NOTE — Discharge Instructions (Signed)

## 2021-05-27 ENCOUNTER — Telehealth: Payer: Self-pay

## 2021-05-27 NOTE — Telephone Encounter (Signed)
TC placed to patient to f/u re: Palliative care. VM left

## 2021-09-10 ENCOUNTER — Other Ambulatory Visit: Payer: Self-pay | Admitting: Neurological Surgery

## 2021-09-27 ENCOUNTER — Inpatient Hospital Stay (HOSPITAL_COMMUNITY)
Admission: EM | Admit: 2021-09-27 | Discharge: 2021-10-01 | DRG: 552 | Disposition: A | Discharge status: Home / Self Care | Payer: Worker's Compensation | Attending: Internal Medicine | Admitting: Internal Medicine

## 2021-09-27 ENCOUNTER — Other Ambulatory Visit: Payer: Self-pay

## 2021-09-27 ENCOUNTER — Encounter (HOSPITAL_COMMUNITY): Payer: Self-pay | Admitting: Emergency Medicine

## 2021-09-27 DIAGNOSIS — M48061 Spinal stenosis, lumbar region without neurogenic claudication: Principal | ICD-10-CM | POA: Diagnosis present

## 2021-09-27 DIAGNOSIS — K219 Gastro-esophageal reflux disease without esophagitis: Secondary | ICD-10-CM | POA: Diagnosis present

## 2021-09-27 DIAGNOSIS — M4804 Spinal stenosis, thoracic region: Secondary | ICD-10-CM

## 2021-09-27 DIAGNOSIS — M5126 Other intervertebral disc displacement, lumbar region: Secondary | ICD-10-CM | POA: Diagnosis present

## 2021-09-27 DIAGNOSIS — M549 Dorsalgia, unspecified: Secondary | ICD-10-CM | POA: Diagnosis not present

## 2021-09-27 DIAGNOSIS — Z8249 Family history of ischemic heart disease and other diseases of the circulatory system: Secondary | ICD-10-CM

## 2021-09-27 DIAGNOSIS — E063 Autoimmune thyroiditis: Secondary | ICD-10-CM | POA: Diagnosis present

## 2021-09-27 DIAGNOSIS — H9190 Unspecified hearing loss, unspecified ear: Secondary | ICD-10-CM | POA: Diagnosis present

## 2021-09-27 DIAGNOSIS — Z8774 Personal history of (corrected) congenital malformations of heart and circulatory system: Secondary | ICD-10-CM

## 2021-09-27 DIAGNOSIS — Z885 Allergy status to narcotic agent status: Secondary | ICD-10-CM

## 2021-09-27 DIAGNOSIS — R52 Pain, unspecified: Secondary | ICD-10-CM | POA: Diagnosis present

## 2021-09-27 DIAGNOSIS — M5124 Other intervertebral disc displacement, thoracic region: Secondary | ICD-10-CM | POA: Diagnosis present

## 2021-09-27 DIAGNOSIS — Z79890 Hormone replacement therapy (postmenopausal): Secondary | ICD-10-CM

## 2021-09-27 DIAGNOSIS — J45909 Unspecified asthma, uncomplicated: Secondary | ICD-10-CM | POA: Diagnosis present

## 2021-09-27 DIAGNOSIS — I10 Essential (primary) hypertension: Secondary | ICD-10-CM | POA: Diagnosis present

## 2021-09-27 DIAGNOSIS — F419 Anxiety disorder, unspecified: Secondary | ICD-10-CM | POA: Diagnosis present

## 2021-09-27 DIAGNOSIS — Z833 Family history of diabetes mellitus: Secondary | ICD-10-CM

## 2021-09-27 DIAGNOSIS — E7849 Other hyperlipidemia: Secondary | ICD-10-CM | POA: Diagnosis present

## 2021-09-27 DIAGNOSIS — Z79899 Other long term (current) drug therapy: Secondary | ICD-10-CM

## 2021-09-27 DIAGNOSIS — Z8349 Family history of other endocrine, nutritional and metabolic diseases: Secondary | ICD-10-CM

## 2021-09-27 DIAGNOSIS — Z881 Allergy status to other antibiotic agents status: Secondary | ICD-10-CM

## 2021-09-27 DIAGNOSIS — M4800 Spinal stenosis, site unspecified: Secondary | ICD-10-CM | POA: Diagnosis present

## 2021-09-27 DIAGNOSIS — Z6836 Body mass index (BMI) 36.0-36.9, adult: Secondary | ICD-10-CM

## 2021-09-27 DIAGNOSIS — Z882 Allergy status to sulfonamides status: Secondary | ICD-10-CM

## 2021-09-27 DIAGNOSIS — E669 Obesity, unspecified: Secondary | ICD-10-CM | POA: Diagnosis present

## 2021-09-27 DIAGNOSIS — Z83438 Family history of other disorder of lipoprotein metabolism and other lipidemia: Secondary | ICD-10-CM

## 2021-09-27 DIAGNOSIS — G902 Horner's syndrome: Secondary | ICD-10-CM | POA: Diagnosis present

## 2021-09-27 MED ORDER — DEXAMETHASONE SODIUM PHOSPHATE 10 MG/ML IJ SOLN
10.0000 mg | Freq: Once | INTRAMUSCULAR | Status: AC
Start: 2021-09-28 — End: 2021-09-28
  Administered 2021-09-28: 10 mg via INTRAVENOUS
  Filled 2021-09-27: qty 1

## 2021-09-27 MED ORDER — DIAZEPAM 5 MG/ML IJ SOLN
5.0000 mg | Freq: Once | INTRAMUSCULAR | Status: AC
Start: 2021-09-28 — End: 2021-09-28
  Administered 2021-09-28: 5 mg via INTRAVENOUS
  Filled 2021-09-27: qty 2

## 2021-09-27 MED ORDER — OXYCODONE-ACETAMINOPHEN 5-325 MG PO TABS
1.0000 | ORAL_TABLET | Freq: Once | ORAL | Status: AC
  Administered 2021-09-27: 1 via ORAL
  Filled 2021-09-27: qty 1

## 2021-09-27 MED ORDER — HYDROMORPHONE HCL 1 MG/ML IJ SOLN
1.0000 mg | Freq: Once | INTRAMUSCULAR | Status: AC
  Administered 2021-09-28: 1 mg via INTRAVENOUS
  Filled 2021-09-27: qty 1

## 2021-09-27 NOTE — ED Provider Notes (Signed)
[facility] EMERGENCY DEPARTMENT Provider Note  CSN: [id number] Arrival date & time: 09/27/21 2119  Chief Complaint(s) Back Pain  HPI MARISSA LOWREY is a 31 y.o. male {Add pertinent medical, surgical, social history, OB history to HPI:1}    Back Pain   Past Medical History Past Medical History:  Diagnosis Date   Asthma, chronic    Dyspepsia    Environmental allergies    Familial combined hyperlipidemia    GERD (gastroesophageal reflux disease)    Goiter    Hard of hearing    Horner's syndrome    Hypertension    Hypothyroidism, acquired, autoimmune    Obesity (BMI 30-39.9)    Status post surgery for complex congenital heart disease    Thyroiditis, autoimmune    Patient Active Problem List   Diagnosis Date Noted   Status post surgery for complex congenital heart disease    Familial combined hyperlipidemia    Goiter    Thyroiditis, autoimmune    Dyspepsia    Hypothyroidism, acquired, autoimmune    GERD (gastroesophageal reflux disease)    Hypertension    Asthma, chronic    Environmental allergies    Horner's syndrome    Hard of hearing    Obesity (BMI 30-39.9)    Essential hypertension, benign 08/23/2010   Home Medication(s) Prior to Admission medications   Medication Sig Start Date End Date Taking? Authorizing Provider  cetirizine (ZYRTEC ALLERGY) 10 MG tablet Take 1 tablet (10 mg total) by mouth daily. 07/25/17   Georgiana Shore, PA-C  doxycycline (VIBRAMYCIN) 100 MG capsule Take 1 capsule (100 mg total) by mouth 2 (two) times daily. Patient not taking: Reported on 07/24/2017 04/29/17   Ivery Quale, PA-C  ELDERBERRY PO Take 1 capsule by mouth daily.    [provider]  guaifenesin (ROBITUSSIN) 100 MG/5ML syrup Take 5-10 mLs (100-200 mg total) by mouth every 4 (four) hours as needed for cough. 07/25/17   Georgiana Shore, PA-C  HYDROcodone-acetaminophen (NORCO/VICODIN) 5-325 MG tablet Take 2 tablets by mouth every 4 (four)  hours as needed. 03/25/20   Rolan Bucco, MD  levothyroxine (SYNTHROID, LEVOTHROID) 125 MCG tablet TAKE ONE TABLET BY MOUTH 6 DAYS A WEEK (MON-SAT) AND ONE-HALF TABLET BY MOUTH ON SUN 04/01/11   David Stall, MD  lisinopril (PRINIVIL,ZESTRIL) 10 MG tablet Take 1 tablet lisinopril 10 mg in the morning and 1/2 tablet 5 mg in the evening. 01/05/13   David Stall, MD  loratadine-pseudoephedrine (CLARITIN-D 12 HOUR) 5-120 MG tablet Take 1 tablet by mouth 2 (two) times daily. 04/29/17   Ivery Quale, PA-C  LORazepam (ATIVAN) 0.5 MG tablet Take 0.5 mg by mouth every 8 (eight) hours.    [provider]  methylPREDNISolone (MEDROL DOSEPAK) 4 MG TBPK tablet Follow taper instructions until completed 05/15/20   Liberty Handy, PA-C  ondansetron (ZOFRAN ODT) 4 MG disintegrating tablet Take 1 tablet (4 mg total) by mouth every 8 (eight) hours as needed for nausea or vomiting. 07/25/17   Mathews Robinsons B, PA-C  ranitidine (ZANTAC) 150 MG tablet Take 150 mg by mouth 2 (two) times daily as needed for heartburn.    [provider]  sodium chloride (OCEAN) 0.65 % SOLN nasal spray Place 1 spray into both nostrils as needed for congestion. 07/25/17   Georgiana Shore, PA-C  Allergies Azithromycin, Codeine, Septra [bactrim], and Sulfa antibiotics  Review of Systems Review of Systems  Musculoskeletal:  Positive for back pain.   As noted in HPI  Physical Exam Vital Signs  I have reviewed the triage vital signs BP 135/86   Pulse (!) 110   Temp 97.9 F (36.6 C) (Oral)   Resp 16   SpO2 99%  *** Physical Exam  ED Results and Treatments Labs (all labs ordered are listed, but only abnormal results are displayed) Labs Reviewed - No data to display                                                                                                                        EKG  EKG Interpretation  Date/Time:    Ventricular Rate:    PR Interval:    QRS Duration:   QT Interval:    QTC Calculation:   R Axis:     Text Interpretation:         Radiology No results found.  Pertinent labs & imaging results that were available during my care of the patient were reviewed by me and considered in my medical decision making (see MDM for details).  Medications Ordered in ED Medications  oxyCODONE-acetaminophen (PERCOCET/ROXICET) 5-325 MG per tablet 1 tablet (1 tablet Oral Given 09/27/21 2146)                                                                                                                                     Procedures Procedures  (including critical care time)  Medical Decision Making / ED Course    Complexity of Problem:  Co-morbidities/SDOH that complicate the patient evaluation/care: ***  Additional history obtained: ***  Patient's presenting problem/concern, DDX, and MDM listed below: ***  Hospitalization Considered:  ***  Initial Intervention:  ***    Complexity of Data:   Cardiac Monitoring: ***  Laboratory Tests ordered listed below with my independent interpretation: ***   Imaging Studies ordered listed below with my independent interpretation: ***     ED Course:    Assessment, Add'l Intervention, and Reassessment: ***  ***  Final Clinical Impression(s) / ED Diagnoses Final diagnoses:  None    {Document critical care time when appropriate:1}  {Document review of labs and clinical decision tools ie heart score, Chads2Vasc2 etc:1}  {Document your independent review of radiology images, and any outside records:1} {Document your discussion with  family members, caretakers, and with consultants:1} {Document social determinants of health affecting pt's care:1} {Document your decision making why or why not admission, treatments were needed:1} This chart was dictated using voice recognition  software.  Despite best efforts to proofread,  errors can occur which can change the documentation meaning.

## 2021-09-27 NOTE — ED Provider Triage Note (Signed)
Emergency Medicine Provider Triage Evaluation Note  Albert Watson , a 31 y.o. male  was evaluated in triage.  Pt complains of acute on chronic thoracic back pain.  Patient with multiple bulging disks, he is scheduled for discectomy on July 17 with Dr. Danielle Dess.  He denies any recent falls or fevers.  He reports that he is taking narcotic pain medication at home and is not having any relief.  He reports that he occasionally has some jerking of his lower legs which has been ongoing for several weeks, and has been evaluated by his neurologist.  He denies any new numbness, tingling, urinary incontinence, saddle anesthesia, difficulty with defecation.  He denies previous history of IV drug use, chronic corticosteroid use, previous cancer.  Review of Systems  Positive: Back pain Negative: Numbness, tingling  Physical Exam  BP 135/86   Pulse (!) 110   Temp 97.9 F (36.6 C) (Oral)   Resp 16   SpO2 99%  Gen:   Awake, no distress   Resp:  Normal effort  MSK:   Moves extremities without difficulty  Other:  Tenderness palpation throughout the thoracic spine, and paraspinous muscles.  He has intact strength of bilateral lower extremities with some pain.  Medical Decision Making  Medically screening exam initiated at 9:40 PM.  Appropriate orders placed.  Tennis Ship was informed that the remainder of the evaluation will be completed by another provider, this initial triage assessment does not replace that evaluation, and the importance of remaining in the ED until their evaluation is complete.  At this time I do not think additional radiographic investigation of the thoracic spine is warranted, we will help to control patient's pain, and wait for further evaluation to see if patient has intractable pain requiring admission.   Olene Floss, [place] 09/27/21 2141

## 2021-09-27 NOTE — ED Triage Notes (Addendum)
Patient reports worsening mid back pain onset last week unrelieved by prescription pain medications , denies recent injury , scheduled for back surgery next month.

## 2021-09-28 ENCOUNTER — Encounter (HOSPITAL_COMMUNITY): Payer: Self-pay | Admitting: Internal Medicine

## 2021-09-28 ENCOUNTER — Emergency Department (HOSPITAL_COMMUNITY): Payer: 59

## 2021-09-28 DIAGNOSIS — F17220 Nicotine dependence, chewing tobacco, uncomplicated: Secondary | ICD-10-CM

## 2021-09-28 DIAGNOSIS — E063 Autoimmune thyroiditis: Secondary | ICD-10-CM | POA: Diagnosis not present

## 2021-09-28 DIAGNOSIS — M4804 Spinal stenosis, thoracic region: Secondary | ICD-10-CM | POA: Diagnosis not present

## 2021-09-28 DIAGNOSIS — F419 Anxiety disorder, unspecified: Secondary | ICD-10-CM

## 2021-09-28 DIAGNOSIS — M4800 Spinal stenosis, site unspecified: Secondary | ICD-10-CM | POA: Diagnosis present

## 2021-09-28 DIAGNOSIS — I10 Essential (primary) hypertension: Secondary | ICD-10-CM | POA: Diagnosis not present

## 2021-09-28 LAB — CBC WITH DIFFERENTIAL/PLATELET
Abs Immature Granulocytes: 0.04 10*3/uL (ref 0.00–0.07)
Basophils Absolute: 0.1 10*3/uL (ref 0.0–0.1)
Basophils Relative: 1 %
Eosinophils Absolute: 0.3 10*3/uL (ref 0.0–0.5)
Eosinophils Relative: 4 %
HCT: 43.8 % (ref 39.0–52.0)
Hemoglobin: 14.7 g/dL (ref 13.0–17.0)
Immature Granulocytes: 1 %
Lymphocytes Relative: 41 %
Lymphs Abs: 3.1 10*3/uL (ref 0.7–4.0)
MCH: 28.2 pg (ref 26.0–34.0)
MCHC: 33.6 g/dL (ref 30.0–36.0)
MCV: 84.1 fL (ref 80.0–100.0)
Monocytes Absolute: 0.6 10*3/uL (ref 0.1–1.0)
Monocytes Relative: 8 %
Neutro Abs: 3.5 10*3/uL (ref 1.7–7.7)
Neutrophils Relative %: 45 %
Platelets: 205 10*3/uL (ref 150–400)
RBC: 5.21 MIL/uL (ref 4.22–5.81)
RDW: 13 % (ref 11.5–15.5)
WBC: 7.6 10*3/uL (ref 4.0–10.5)
nRBC: 0 % (ref 0.0–0.2)

## 2021-09-28 LAB — BASIC METABOLIC PANEL
Anion gap: 9 (ref 5–15)
BUN: 12 mg/dL (ref 6–20)
CO2: 24 mmol/L (ref 22–32)
Calcium: 9.1 mg/dL (ref 8.9–10.3)
Chloride: 104 mmol/L (ref 98–111)
Creatinine, Ser: 0.84 mg/dL (ref 0.61–1.24)
GFR, Estimated: >60 mL/min (ref >60)
Glucose, Bld: 80 mg/dL (ref 70–99)
Potassium: 4.2 mmol/L (ref 3.5–5.1)
Sodium: 137 mmol/L (ref 135–145)

## 2021-09-28 MED ORDER — ACETAMINOPHEN 325 MG PO TABS: 650.0000 mg | ORAL_TABLET | Freq: Four times a day (QID) | ORAL | Status: DC | PRN

## 2021-09-28 MED ORDER — BUPROPION HCL ER (SR) 150 MG PO TB12
150.0000 mg | ORAL_TABLET | Freq: Two times a day (BID) | ORAL | Status: DC
  Administered 2021-09-28 – 2021-10-01 (×6): 150 mg via ORAL
  Filled 2021-09-28 (×6): qty 1

## 2021-09-28 MED ORDER — HYDROMORPHONE HCL 1 MG/ML IJ SOLN
1.0000 mg | Freq: Once | INTRAMUSCULAR | Status: AC
  Administered 2021-09-28: 1 mg via INTRAVENOUS
  Filled 2021-09-28: qty 1

## 2021-09-28 MED ORDER — LISINOPRIL 20 MG PO TABS
20.0000 mg | ORAL_TABLET | Freq: Two times a day (BID) | ORAL | Status: DC
  Administered 2021-09-28 – 2021-10-01 (×6): 20 mg via ORAL
  Filled 2021-09-28 (×6): qty 1

## 2021-09-28 MED ORDER — SENNOSIDES-DOCUSATE SODIUM 8.6-50 MG PO TABS: 1.0000 | ORAL_TABLET | Freq: Every evening | ORAL | Status: DC | PRN

## 2021-09-28 MED ORDER — KETOROLAC TROMETHAMINE 15 MG/ML IJ SOLN
15.0000 mg | Freq: Once | INTRAMUSCULAR | Status: AC
  Administered 2021-09-28: 15 mg via INTRAVENOUS
  Filled 2021-09-28: qty 1

## 2021-09-28 MED ORDER — TAMSULOSIN HCL 0.4 MG PO CAPS
0.4000 mg | ORAL_CAPSULE | Freq: Every day | ORAL | Status: DC
  Administered 2021-09-28 – 2021-10-01 (×4): 0.4 mg via ORAL
  Filled 2021-09-28 (×4): qty 1

## 2021-09-28 MED ORDER — ONDANSETRON HCL 4 MG/2ML IJ SOLN: 4.0000 mg | Freq: Four times a day (QID) | INTRAMUSCULAR | Status: DC | PRN

## 2021-09-28 MED ORDER — HYDROMORPHONE HCL 2 MG PO TABS
2.0000 mg | ORAL_TABLET | ORAL | Status: DC | PRN
  Administered 2021-09-28 – 2021-09-29 (×4): 2 mg via ORAL
  Filled 2021-09-28 (×6): qty 1

## 2021-09-28 MED ORDER — HYDROXYZINE HCL 25 MG PO TABS: 50.0000 mg | ORAL_TABLET | Freq: Four times a day (QID) | ORAL | Status: DC | PRN

## 2021-09-28 MED ORDER — MELOXICAM 7.5 MG PO TABS
7.5000 mg | ORAL_TABLET | Freq: Every day | ORAL | Status: DC
  Administered 2021-09-28 – 2021-09-29 (×2): 7.5 mg via ORAL
  Filled 2021-09-28 (×3): qty 1

## 2021-09-28 MED ORDER — PREGABALIN 75 MG PO CAPS
75.0000 mg | ORAL_CAPSULE | Freq: Three times a day (TID) | ORAL | Status: DC
  Administered 2021-09-28 – 2021-10-01 (×10): 75 mg via ORAL
  Filled 2021-09-28: qty 1
  Filled 2021-09-28: qty 3
  Filled 2021-09-28 (×8): qty 1

## 2021-09-28 MED ORDER — HYDROMORPHONE HCL 1 MG/ML IJ SOLN
0.5000 mg | INTRAMUSCULAR | Status: DC | PRN
  Administered 2021-09-28 – 2021-09-30 (×9): 0.5 mg via INTRAVENOUS
  Filled 2021-09-28 (×2): qty 0.5
  Filled 2021-09-28: qty 1
  Filled 2021-09-28 (×2): qty 0.5
  Filled 2021-09-28: qty 1
  Filled 2021-09-28 (×4): qty 0.5
  Filled 2021-09-28: qty 1

## 2021-09-28 MED ORDER — DULOXETINE HCL 30 MG PO CPEP: 30.0000 mg | ORAL_CAPSULE | Freq: Every day | ORAL | Status: DC

## 2021-09-28 MED ORDER — ACETAMINOPHEN 650 MG RE SUPP: 650.0000 mg | Freq: Four times a day (QID) | RECTAL | Status: DC | PRN

## 2021-09-28 MED ORDER — NALOXONE HCL 0.4 MG/ML IJ SOLN: 0.4000 mg | INTRAMUSCULAR | Status: DC | PRN

## 2021-09-28 MED ORDER — KETOROLAC TROMETHAMINE 15 MG/ML IJ SOLN
15.0000 mg | Freq: Once | INTRAMUSCULAR | Status: AC
Start: 2021-09-28 — End: 2021-09-28
  Administered 2021-09-28: 15 mg via INTRAVENOUS
  Filled 2021-09-28: qty 1

## 2021-09-28 MED ORDER — METHADONE HCL 10 MG PO TABS
20.0000 mg | ORAL_TABLET | Freq: Two times a day (BID) | ORAL | Status: DC
  Administered 2021-09-28 – 2021-10-01 (×7): 20 mg via ORAL
  Filled 2021-09-28 (×7): qty 2

## 2021-09-28 MED ORDER — DULOXETINE HCL 60 MG PO CPEP
90.0000 mg | ORAL_CAPSULE | Freq: Every day | ORAL | Status: DC
  Administered 2021-09-28 – 2021-10-01 (×4): 90 mg via ORAL
  Filled 2021-09-28 (×5): qty 1

## 2021-09-28 MED ORDER — BISACODYL 5 MG PO TBEC: 5.0000 mg | DELAYED_RELEASE_TABLET | Freq: Every day | ORAL | Status: DC | PRN

## 2021-09-28 MED ORDER — DULOXETINE HCL 30 MG PO CPEP
60.0000 mg | ORAL_CAPSULE | Freq: Every day | ORAL | Status: DC
Start: 2021-09-28 — End: 2021-09-28

## 2021-09-28 NOTE — ED Notes (Addendum)
ED TO INPATIENT HANDOFF REPORT    S Name/Age/Gender Albert Watson 31 y.o. male Room/Bed: H021C/H021C  Code Status   Code Status: Full Code  Chief Complaint Spinal stenosis of thoracic region [M48.04]  Triage Note Patient reports worsening mid back pain onset last week unrelieved by prescription pain medications , denies recent injury , scheduled for back surgery next month.    Allergies Allergies  Allergen Reactions   Azithromycin Hives    Pt unsure, received when he was little.    Codeine Hives    Pt unsure, received when he was little.    Septra [Bactrim] Hives    Pt unsure, received when he was little.    Sulfa Antibiotics Hives    Pt unsure, received when he was little.    Alert and oriented x4.  Level of Care/Admitting Diagnosis ED Disposition     ED Disposition  Admit   Condition  --   Comment  Hospital Area: [facility] [[id number]]  Level of Care: Med-Surg [16]  [facility] place patient in observation at [facility] or [facility] if equivalent level of care is available:: No  Covid Evaluation: Asymptomatic - no recent exposure (last 10 days) testing not required  Diagnosis: Spinal stenosis of thoracic region [724.01.ICD-9-CM]  Admitting Physician: Angie Fava [[id number]]  Attending Physician: Angie Fava [[id number]]          B Medical/Surgery History Past Medical History:  Diagnosis Date   Asthma, chronic    Dyspepsia    Environmental allergies    Familial combined hyperlipidemia    GERD (gastroesophageal reflux disease)    Goiter    Hard of hearing    Horner's syndrome    Hypertension    Hypothyroidism, acquired, autoimmune    Obesity (BMI 30-39.9)    Status post surgery for complex congenital heart disease    Thyroiditis, autoimmune    Past Surgical History:  Procedure Laterality Date   CARDIAC SURGERY     complex congenital heart disease     EXTERNAL EAR SURGERY     KNEE SURGERY       A IV  Location/Drains/Wounds Patient Lines/Drains/Airways Status     Active Line/Drains/Airways     Name Placement date Placement time Site Days   Peripheral IV 09/28/21 20 G Left Hand 09/28/21  0011  Hand  less than 1             Labs/Imaging Results for orders placed or performed during the hospital encounter of 09/27/21 (from the past 48 hour(s))  CBC with Differential     Status: None   Collection Time: 09/28/21 12:05 AM  Result Value Ref Range   WBC 7.6 4.0 - 10.5 K/uL   RBC 5.21 4.22 - 5.81 MIL/uL   Hemoglobin 14.7 13.0 - 17.0 g/dL   HCT 25.3 66.4 - 40.3 %   MCV 84.1 80.0 - 100.0 fL   MCH 28.2 26.0 - 34.0 pg   MCHC 33.6 30.0 - 36.0 g/dL   RDW 47.4 25.9 - 56.3 %   Platelets 205 150 - 400 K/uL   nRBC 0.0 0.0 - 0.2 %   Neutrophils Relative % 45 %   Neutro Abs 3.5 1.7 - 7.7 K/uL   Lymphocytes Relative 41 %   Lymphs Abs 3.1 0.7 - 4.0 K/uL   Monocytes Relative 8 %   Monocytes Absolute 0.6 0.1 - 1.0 K/uL   Eosinophils Relative 4 %   Eosinophils Absolute 0.3 0.0 - 0.5 K/uL  Basophils Relative 1 %   Basophils Absolute 0.1 0.0 - 0.1 K/uL   Immature Granulocytes 1 %   Abs Immature Granulocytes 0.04 0.00 - 0.07 K/uL    Comment: Performed at [facility], [address], [place], [place] [address]  Basic metabolic panel     Status: None   Collection Time: 09/28/21 12:05 AM  Result Value Ref Range   Sodium 137 135 - 145 mmol/L   Potassium 4.2 3.5 - 5.1 mmol/L   Chloride 104 98 - 111 mmol/L   CO2 24 22 - 32 mmol/L   Glucose, Bld 80 70 - 99 mg/dL    Comment: Glucose reference range applies only to samples taken after fasting for at least 8 hours.   BUN 12 6 - 20 mg/dL   Creatinine, Ser 9.35 0.61 - 1.24 mg/dL   Calcium 9.1 8.9 - 70.1 mg/dL   GFR, Estimated >77 >93 mL/min    Comment: (NOTE) Calculated using the CKD-EPI Creatinine Equation (2021)    Anion gap 9 5 - 15    Comment: Performed at [facility], [address], [place], [place] [address]       Vitals/Pain Today's Vitals   09/28/21 0431 09/28/21 0521 09/28/21 0553 09/28/21 0555  BP:   138/79   Pulse:   90   Resp:   18   Temp:      TempSrc:      SpO2:   96%   PainSc: 0-No pain 0-No pain  0-No pain    Isolation Precautions No active isolations  Medications Medications  acetaminophen (TYLENOL) tablet 650 mg (has no administration in time range)    Or  acetaminophen (TYLENOL) suppository 650 mg (has no administration in time range)  naloxone (NARCAN) injection 0.4 mg (has no administration in time range)  HYDROmorphone (DILAUDID) injection 0.5 mg (has no administration in time range)  ondansetron (ZOFRAN) injection 4 mg (has no administration in time range)  oxyCODONE-acetaminophen (PERCOCET/ROXICET) 5-325 MG per tablet 1 tablet (1 tablet Oral Given 09/27/21 2146)  dexamethasone (DECADRON) injection 10 mg (10 mg Intravenous Given 09/28/21 0012)  HYDROmorphone (DILAUDID) injection 1 mg (1 mg Intravenous Given 09/28/21 0012)  diazepam (VALIUM) injection 5 mg (5 mg Intravenous Given 09/28/21 0148)  HYDROmorphone (DILAUDID) injection 1 mg (1 mg Intravenous Given 09/28/21 0311)  ketorolac (TORADOL) 15 MG/ML injection 15 mg (15 mg Intravenous Given 09/28/21 0308)  HYDROmorphone (DILAUDID) injection 1 mg (1 mg Intravenous Given 09/28/21 0535)  ketorolac (TORADOL) 15 MG/ML injection 15 mg (15 mg Intravenous Given 09/28/21 0535)    Mobility : Ambulatory  Low fall risk

## 2021-09-28 NOTE — ED Notes (Signed)
Sleeping at present.

## 2021-09-28 NOTE — ED Notes (Signed)
Patient transported to MRI 

## 2021-09-28 NOTE — Progress Notes (Signed)
  Carryover admission to the Day Admitter.  I discussed this case with the EDP, Dr. Eudelia Bunch.  Per these discussions:   This is a 31 year old male with known history of spinal stenosis of the thoracic spine, involving T9 and T10 with associated disc herniation, who is scheduled for surgery with Dr. Eugenie Filler of neurosurgery later this month.  However in the setting of worsening thoracic back pain associated with worsening numbness into the bilateral lower extremities, the patient presented to [facility] ED this evening.   Updated MRI of thoracic spine was obtained, and relative to most recent prior MRI of the thoracic spine performed in November 2022, shows moderate spinal stenosis now involving T8-T10 with associated disc herniation.  EDP discussed patient's case with on-call neurosurgery, Dr. Julien Girt, Who will formally consult and see the patient, including evaluating if the patient's anticipated surgery can be expedited.  Dr. Julien Girt requests that the patient be admitted to the hospitalist service for pain control pending this additional neurosurgical evaluation later today.  I have placed an order for observation to MedSurg.  I have placed some additional preliminary admit orders via the adult multi-morbid admission order set. I have also ordered as needed IV Dilaudid.    Newton Pigg, DO Hospitalist

## 2021-09-28 NOTE — ED Notes (Signed)
ED TO INPATIENT HANDOFF REPORT    S Name/Age/Gender Tennis Ship 31 y.o. male Room/Bed: H021C/H021C  Code Status   Code Status: Full Code  Home/SNF/Other Home Patient oriented to: self, place, time, and situation Is this baseline? Yes   Triage Complete: Triage complete  Chief Complaint Spinal stenosis of thoracic region [M48.04] Spinal stenosis [M48.00]  Triage Note Patient reports worsening mid back pain onset last week unrelieved by prescription pain medications , denies recent injury , scheduled for back surgery next month.    Allergies Allergies  Allergen Reactions   Codeine Hives   Hydrocodone-Guaifenesin Nausea And Vomiting   Septra [Bactrim] Hives   Sulfa Antibiotics Hives   Z-Pak [Azithromycin] Hives   Betadine [Povidone-Iodine] Rash   Penicillins Rash   Suprax [Cefixime] Rash    Level of Care/Admitting Diagnosis ED Disposition     ED Disposition  Admit   Condition  --   Comment  Hospital [facility] [[id number]]  Level of Care: Med-Surg [16]  May place patient in observation at [facility] or [facility] if equivalent level of care is available:: Yes  Covid Evaluation: Asymptomatic - no recent exposure (last 10 days) testing not required  Diagnosis: Spinal stenosis [937169]  Admitting Physician: Nena Polio  Attending Physician: Nena Polio          B Medical/Surgery History Past Medical History:  Diagnosis Date   Asthma, chronic    Dyspepsia    Environmental allergies    Familial combined hyperlipidemia    GERD (gastroesophageal reflux disease)    Goiter    Hard of hearing    Horner's syndrome    Hypertension    Hypothyroidism, acquired, autoimmune    Obesity (BMI 30-39.9)    Status post surgery for complex congenital heart disease    Thyroiditis, autoimmune    Past Surgical History:  Procedure Laterality Date   CARDIAC SURGERY     complex congenital heart disease     EXTERNAL EAR  SURGERY     KNEE SURGERY     LUMBAR SPINE SURGERY       A IV Location/Drains/Wounds Patient Lines/Drains/Airways Status     Active Line/Drains/Airways     Name Placement date Placement time Site Days   Peripheral IV 09/28/21 20 G Left Hand 09/28/21  0011  Hand  less than 1            Intake/Output Last 24 hours No intake or output data in the 24 hours ending 09/28/21 1418  Labs/Imaging Results for orders placed or performed during the hospital encounter of 09/27/21 (from the past 48 hour(s))  CBC with Differential     Status: None   Collection Time: 09/28/21 12:05 AM  Result Value Ref Range   WBC 7.6 4.0 - 10.5 K/uL   RBC 5.21 4.22 - 5.81 MIL/uL   Hemoglobin 14.7 13.0 - 17.0 g/dL   HCT 67.8 93.8 - 10.1 %   MCV 84.1 80.0 - 100.0 fL   MCH 28.2 26.0 - 34.0 pg   MCHC 33.6 30.0 - 36.0 g/dL   RDW 75.1 02.5 - 85.2 %   Platelets 205 150 - 400 K/uL   nRBC 0.0 0.0 - 0.2 %   Neutrophils Relative % 45 %   Neutro Abs 3.5 1.7 - 7.7 K/uL   Lymphocytes Relative 41 %   Lymphs Abs 3.1 0.7 - 4.0 K/uL   Monocytes Relative 8 %   Monocytes Absolute 0.6 0.1 -  1.0 K/uL   Eosinophils Relative 4 %   Eosinophils Absolute 0.3 0.0 - 0.5 K/uL   Basophils Relative 1 %   Basophils Absolute 0.1 0.0 - 0.1 K/uL   Immature Granulocytes 1 %   Abs Immature Granulocytes 0.04 0.00 - 0.07 K/uL    Comment: Performed at [facility], [address] [place], [place] [address]  Basic metabolic panel     Status: None   Collection Time: 09/28/21 12:05 AM  Result Value Ref Range   Sodium 137 135 - 145 mmol/L   Potassium 4.2 3.5 - 5.1 mmol/L   Chloride 104 98 - 111 mmol/L   CO2 24 22 - 32 mmol/L   Glucose, Bld 80 70 - 99 mg/dL    Comment: Glucose reference range applies only to samples taken after fasting for at least 8 hours.   BUN 12 6 - 20 mg/dL   Creatinine, Ser 6.04 0.61 - 1.24 mg/dL   Calcium 9.1 8.9 - 54.0 mg/dL   GFR, Estimated >98 >11 mL/min    Comment: (NOTE) Calculated using the  CKD-EPI Creatinine Equation (2021)    Anion gap 9 5 - 15    Comment: Performed at [facility], [address] [place], [place] [address]   MR LUMBAR SPINE WO CONTRAST  Result Date: 09/28/2021 CLINICAL DATA:  Initial evaluation for acute on chronic mid and lower back pain, ataxia. EXAM: MRI LUMBAR SPINE WITHOUT CONTRAST TECHNIQUE: Multiplanar, multisequence MR imaging of the lumbar spine was performed. No intravenous contrast was administered. COMPARISON:  Comparison made with previous MRI from 05/15/2020. FINDINGS: Segmentation: Vertebral body count is made from the counter sequence on corresponding thoracic spine MRI. Given this, there appear to be 6 lumbar type vertebral bodies, lowest of which will be labeled S1, with a well-formed S1-2 interspace. Please note that this vertebral body count differs from previous exam. Alignment: Trace 2-3 mm retrolisthesis of L4 on L5. Alignment otherwise normal with preservation of the normal lumbar lordosis. Vertebrae: Vertebral body height maintained without acute or chronic fracture. Bone marrow signal intensity within normal limits. No worrisome osseous lesions. Mild discogenic reactive endplate change present about the L4-5 and L5-S1 interspaces. No other abnormal marrow edema. Conus medullaris and cauda equina: Conus extends to the L2 level. Conus and cauda equina appear normal. Paraspinal and other soft tissues: Unremarkable. Disc levels: L1-2:  Unremarkable. L2-3:  Unremarkable. L3-4: Normal interspace. Minimal right greater than left facet spurring. No stenosis. L4-5: Degenerative intervertebral disc space narrowing with diffuse disc bulge and disc desiccation. Mild facet hypertrophy. No significant spinal stenosis. Foramina remain patent. L5-S1: Degenerative intervertebral disc space narrowing with disc desiccation and reactive endplate spurring. Diffuse disc bulge, eccentric to the right, with associated right paracentral annular fissure. Previous right  hemi laminectomy with micro discectomy. No residual or recurrent spinal stenosis. Mild to moderate right with mild left L5 foraminal stenosis, similar. S1-2: No significant disc bulge. Moderate bilateral facet hypertrophy. Conjoined S1 and S2 nerve roots on the right. No spinal stenosis. Foramina remain patent. IMPRESSION: 1. No acute or emergent finding within the lumbar spine. 2. Please note that vertebral body count on this exam is made from the dens on the counter sequence of the corresponding thoracic spine MRI. Given this, there appear to be 6 lumbar type vertebral bodies, lowest of which is labeled S1, with a well-formed S1-2 interspace. This vertebral body count differs from previous exam, and careful correlation with numbering system on this exam recommended prior  to any potential future intervention. 3. Prior right hemi laminectomy with micro discectomy at L5-S1 without recurrent spinal stenosis. Mild to moderate right worse than left L5 foraminal narrowing at this level is relatively similar. 4. Additional degenerative disc disease at L4-5 without significant stenosis. Electronically Signed   By: Rise Mu M.D.   On: 09/28/2021 04:28   MR THORACIC SPINE WO CONTRAST  Result Date: 09/28/2021 CLINICAL DATA:  Initial evaluation for acute on chronic mid back pain, ataxia. EXAM: MRI THORACIC SPINE WITHOUT CONTRAST TECHNIQUE: Multiplanar, multisequence MR imaging of the thoracic spine was performed. No intravenous contrast was administered. COMPARISON:  Prior MRI from 05/15/2020. FINDINGS: Alignment:  Examination mildly degraded by motion artifact. Vertebral bodies normally aligned with preservation of the normal thoracic kyphosis. No listhesis. Underlying mild scoliosis noted. Vertebrae: Chronic compression deformities with endplate Schmorl's nodes present at the superior endplates of T7 and T11, stable. No significant associated edema on today's exam. No acute or interval fracture. Bone marrow  signal intensity within normal limits. No worrisome osseous lesions or abnormal marrow edema. Cord: Normal signal morphology. No convincing cord signal changes on this motion degraded exam. Paraspinal and other soft tissues: Paraspinous soft tissues within normal limits. Approximate 2 cm simple cyst noted within the subcapsular right hepatic lobe. Visualized visceral structures otherwise unremarkable. Disc levels: T1-2:  Unremarkable. T2-3: Unremarkable. T3-4:  Minimal left eccentric disc bulge.  No stenosis. T4-5: Right paracentral disc protrusion with annular fissure mildly indents the right ventral thecal sac. Prominence of the dorsal epidural fat. No significant spinal stenosis. Foramina remain patent. T5-6: Small right paracentral disc protrusion. Prominence of the dorsal epidural fat. No significant stenosis. T6-7: Right paracentral disc protrusion with slight superior migration. Mild flattening of the right ventral cord. Prominence of the dorsal epidural fat with borderline mild spinal stenosis. Foramina remain patent. T7-8: Right paracentral disc protrusion with both superior and inferior migration. Flattening of the right cord without cord signal changes. Prominence of the dorsal epidural fat. Mild spinal stenosis. Foramina remain patent. T8-9: Right paracentral disc protrusion indents the right ventral thecal sac. Slight superior and inferior migration. Flattening of the right cord without cord signal changes. Prominence of the dorsal epidural fat. Moderate spinal stenosis. Foramina remain patent. T9-10: Left paracentral disc extrusion contacts and flattens the left hemicord. No visible cord signal changes. Moderate spinal stenosis. Foramina remain patent. T10-11: Disc bulge with reactive endplate spurring. Superimposed right paracentral disc protrusion contacts and mildly flattens the right ventral cord. No cord signal changes. Bilateral facet hypertrophy. No significant foraminal stenosis. T11-12:  Chronic endplate Schmorl's node deformity without disc bulge. Bilateral facet hypertrophy. Mild right foraminal stenosis. Left neural foramen remains patent. T12-L1: Left paracentral disc protrusion with superior migration. Protruding disc contacts and mildly flattens the ventral cord. No cord signal changes. Bilateral facet hypertrophy. Mild spinal stenosis. Foramina remain patent. IMPRESSION: 1. Multifocal disc protrusions involving the T4-5 through T12-L1 levels with secondary cord flattening at multiple levels as above. No visible cord signal changes. Associated up to moderate spinal stenosis, most pronounced at T8-9 and T9-10. 2. Chronic superior endplate compression fractures with endplate Schmorl's node deformities at T7 and T11. 3. No other acute or emergent finding. Electronically Signed   By: Rise Mu M.D.   On: 09/28/2021 04:12    Pending Labs Unresulted Labs (From admission, onward)     Start     Ordered   09/29/21 0500  Basic metabolic panel  Tomorrow morning,   R  09/28/21 1014   09/29/21 0500  CBC  Tomorrow morning,   R        09/28/21 1014            Vitals/Pain Today's Vitals   09/28/21 0553 09/28/21 0555 09/28/21 0634 09/28/21 1126  BP: 138/79     Pulse: 90     Resp: 18     Temp:      TempSrc:      SpO2: 96%     PainSc:  0-No pain 0-No pain 7     Isolation Precautions No active isolations  Medications Medications  acetaminophen (TYLENOL) tablet 650 mg (has no administration in time range)    Or  acetaminophen (TYLENOL) suppository 650 mg (has no administration in time range)  naloxone [facility]) injection 0.4 mg (has no administration in time range)  HYDROmorphone (DILAUDID) injection 0.5 mg (0.5 mg Intravenous Given 09/28/21 1411)  ondansetron (ZOFRAN) injection 4 mg (has no administration in time range)  senna-docusate (Senokot-S) tablet 1 tablet (has no administration in time range)  bisacodyl (DULCOLAX) EC tablet 5 mg (has no administration  in time range)  methadone (DOLOPHINE) tablet 20 mg (20 mg Oral Given 09/28/21 1409)  pregabalin (LYRICA) capsule 75 mg (75 mg Oral Given 09/28/21 1409)  HYDROmorphone (DILAUDID) tablet 2 mg (has no administration in time range)  oxyCODONE-acetaminophen (PERCOCET/ROXICET) 5-325 MG per tablet 1 tablet (1 tablet Oral Given 09/27/21 2146)  dexamethasone (DECADRON) injection 10 mg (10 mg Intravenous Given 09/28/21 0012)  HYDROmorphone (DILAUDID) injection 1 mg (1 mg Intravenous Given 09/28/21 0012)  diazepam (VALIUM) injection 5 mg (5 mg Intravenous Given 09/28/21 0148)  HYDROmorphone (DILAUDID) injection 1 mg (1 mg Intravenous Given 09/28/21 0311)  ketorolac (TORADOL) 15 MG/ML injection 15 mg (15 mg Intravenous Given 09/28/21 0308)  HYDROmorphone (DILAUDID) injection 1 mg (1 mg Intravenous Given 09/28/21 0535)  ketorolac (TORADOL) 15 MG/ML injection 15 mg (15 mg Intravenous Given 09/28/21 0535)    Mobility non-ambulatory Low fall risk   Focused Assessments    R Recommendations: See Admitting Provider Note

## 2021-09-28 NOTE — Plan of Care (Signed)

## 2021-09-28 NOTE — Evaluation (Signed)
Physical Therapy Evaluation Patient Details Name: Albert Watson MRN: [id number] DOB: [birth year] Today's Date: 09/28/2021  History of Present Illness  31 y.o. male presents to [facility] hospital with increasing back pain and LE numbness/weakness. PMH includes asthma, GERD, goiter, HTN, hypothyroidism.  Clinical Impression  Pt presents to PT with significant pain as well as weakness and numbness in lower extremities. Pt reports a history of falls related to LE buckling. Pt is able to ambulate for household distances with UE support. Pt will benefit from PT services in an effort to improve strength and balance while reducing falls risk. PT recommends outpatient PT at the time of discharge.     Recommendations for follow up therapy are one component of a multi-disciplinary discharge planning process, led by the attending physician.  Recommendations may be updated based on patient status, additional functional criteria and insurance authorization.  Follow Up Recommendations Outpatient PT      Assistance Recommended at Discharge PRN  Patient can return home with the following  Help with stairs or ramp for entrance;Assist for transportation    Equipment Recommendations None recommended by PT  Recommendations for Other Services       Functional Status Assessment Patient has had a recent decline in their functional status and demonstrates the ability to make significant improvements in function in a reasonable and predictable amount of time.     Precautions / Restrictions Precautions Precautions: Fall Restrictions Weight Bearing Restrictions: No      Mobility  Bed Mobility Overal bed mobility: Independent                  Transfers Overall transfer level: Modified independent Equipment used: Straight cane                    Ambulation/Gait Ambulation/Gait assistance: Modified independent (Device/Increase time) Gait Distance (Feet): 80 Feet Assistive device: Straight  cane Gait Pattern/deviations: Step-through pattern Gait velocity: reduced Gait velocity interpretation: <1.8 ft/sec, indicate of risk for recurrent falls   General Gait Details: pt with slowed step-through gait  Stairs            Wheelchair Mobility    Modified Rankin (Stroke Patients Only)       Balance Overall balance assessment: Needs assistance Sitting-balance support: No upper extremity supported, Feet supported Sitting balance-Leahy Scale: Good     Standing balance support: Single extremity supported, Reliant on assistive device for balance Standing balance-Leahy Scale: Poor                               Pertinent Vitals/Pain Pain Assessment Pain Assessment: 0-10 Pain Score: 9  Pain Location: back Pain Descriptors / Indicators: Aching Pain Intervention(s): RN gave pain meds during session    Home Living Family/patient expects to be discharged to:: Private residence Living Arrangements: Spouse/significant other;Children (kids 2, 4, 6) Available Help at Discharge: Family;Available PRN/intermittently Type of Home: House Home Access: Stairs to enter Entrance Stairs-Rails: None Entrance Stairs-Number of Steps: 1+1   Home Layout: One level Home Equipment: Rollator (4 wheels);Cane - single point;Shower seat      Prior Function Prior Level of Function : Independent/Modified Independent             Mobility Comments: ambulating for household distances with SPC due to back pain, doesnt drive due to LE numbness       Hand Dominance        Extremity/Trunk Assessment   Upper  Extremity Assessment Upper Extremity Assessment: Overall WFL for tasks assessed    Lower Extremity Assessment Lower Extremity Assessment: RLE deficits/detail;LLE deficits/detail RLE Deficits / Details: grossly 4/5 RLE Sensation: decreased light touch LLE Deficits / Details: grossly 4/5 LLE Sensation: decreased light touch    Cervical / Trunk  Assessment Cervical / Trunk Assessment: Normal  Communication   Communication: No difficulties  Cognition Arousal/Alertness: Awake/alert Behavior During Therapy: WFL for tasks assessed/performed Overall Cognitive Status: Within Functional Limits for tasks assessed                                          General Comments General comments (skin integrity, edema, etc.): VSS on RA    Exercises     Assessment/Plan    PT Assessment Patient needs continued PT services  PT Problem List Decreased strength;Decreased activity tolerance;Decreased balance;Decreased mobility;Impaired sensation;Pain       PT Treatment Interventions DME instruction;Gait training;Stair training;Therapeutic activities;Therapeutic exercise;Balance training;Neuromuscular re-education;Patient/family education    PT Goals (Current goals can be found in the Care Plan section)  Acute Rehab PT Goals Patient Stated Goal: to return to independence and reduced pain PT Goal Formulation: With patient Time For Goal Achievement: 10/12/21 Potential to Achieve Goals: Good Additional Goals Additional Goal #1: Pt will score >19/24 on the DGI to indicate a reduced risk for falls    Frequency Min 3X/week     Co-evaluation               AM-PAC PT "6 Clicks" Mobility  Outcome Measure Help needed turning from your back to your side while in a flat bed without using bedrails?: None Help needed moving from lying on your back to sitting on the side of a flat bed without using bedrails?: None Help needed moving to and from a bed to a chair (including a wheelchair)?: None Help needed standing up from a chair using your arms (e.g., wheelchair or bedside chair)?: None Help needed to walk in hospital room?: A Little Help needed climbing 3-5 steps with a railing? : A Little 6 Click Score: 22    End of Session   Activity Tolerance: Patient limited by pain (limiting ambulation distances) Patient left: in  chair;with call bell/phone within reach;with chair alarm set Nurse Communication: Mobility status PT Visit Diagnosis: Other abnormalities of gait and mobility (R26.89);Other symptoms and signs involving the nervous system (R29.898)    Time: 9326-7124 PT Time Calculation (min) (ACUTE ONLY): 25 min   Charges:   PT Evaluation $PT Eval Low Complexity: 1 Low          Arlyss Gandy, PT, DPT Acute Rehabilitation Office [phone]   Arlyss Gandy 09/28/2021, 5:10 PM

## 2021-09-28 NOTE — H&P (Addendum)
History and Physical    TIANDRE Watson NWG:[id number] DOB: 19-Dec-1990 DOA: 09/27/2021  PCP: Dr. Jeraldine Loots Patient coming from: Home Chief Complaint: Back pain, numbness  HPI: Albert Watson is a 31 y.o. male with medical history significant of asthma, GERD, goiter, HTN, hypothyroidism, spinal stenosis who presents for worsening back pain and numbness in the leg.  He notes that he is scheduled to have back surgery with NSG outpatient.  He is on methadone and oxycodone for breakthrough pain.  He is also on lyrica.  He has been having slowly worsening pain that is associated with numbness in the legs, particularly on the right.  He also has subjective weakness.  He has had no urinary or bowel incontinence.  He notes that the oxycodone does not seem to help with his pain much and that he has requested a change to a different medication.   ED Course: In the ED, his blood work was overall normal.  MRI spine did show some changes, but no emergent finding.  NSG was consulted.  He had improvement of his pain with PRN IV dilaudid.   Review of Systems: As per HPI otherwise all other systems reviewed and are negative.  Past Medical History:  Diagnosis Date   Asthma, chronic    Dyspepsia    Environmental allergies    Familial combined hyperlipidemia    GERD (gastroesophageal reflux disease)    Goiter    Hard of hearing    Horner's syndrome    Hypertension    Hypothyroidism, acquired, autoimmune    Obesity (BMI 30-39.9)    Status post surgery for complex congenital heart disease    Thyroiditis, autoimmune     Past Surgical History:  Procedure Laterality Date   CARDIAC SURGERY     complex congenital heart disease     EXTERNAL EAR SURGERY     KNEE SURGERY     LUMBAR SPINE SURGERY      Social History  reports that he has never smoked. His smokeless tobacco use includes snuff. He reports that he does not drink alcohol and does not use drugs.  Allergies  Allergen Reactions   Codeine Hives    Hydrocodone-Guaifenesin Nausea And Vomiting   Septra [Bactrim] Hives   Sulfa Antibiotics Hives   Z-Pak [Azithromycin] Hives   Betadine [Povidone-Iodine] Rash   Penicillins Rash   Suprax [Cefixime] Rash    Family History  Problem Relation Age of Onset   Hyperlipidemia Mother        Increased cholesterol and triglycerides   Diabetes Mother        T2 DM   Obesity Mother    Thyroid disease Father    Hyperlipidemia Father        High cholesterol   Heart disease Maternal Uncle        MI and CABG   Heart disease Maternal Grandmother        CABG   Hypertension Maternal Grandmother        T2 DM   Heart disease Maternal Grandfather        CABG and MIs   Cancer Maternal Grandfather        Skin   Heart disease Paternal Grandfather        Died of MI    Medications:  Tylenol Bupropion 150mg  BID Duloxetine 90mg  daily Hydroxyzine 50mg  q 6 hours for anxiety Lisinopril 20mg  daily Meloxicam 7.5mg  daily Methadone 20mg  BID Oxycodone 30mg  q 4 hours for breakthrough pain Pregabalin 75mg  TID Tamsulosin 0.4mg   daily  Physical Exam: Vitals:   09/27/21 2124 09/28/21 0144 09/28/21 0553  BP: 135/86 137/79 138/79  Pulse: (!) 110 92 90  Resp: 16 18 18   Temp: 97.9 F (36.6 C)    TempSrc: Oral    SpO2: 99% 95% 96%    Constitutional: NAD, calm, comfortable Eyes: lids and conjunctivae normal ENMT: Mucous membranes are moist.  Neck: normal, supple Respiratory: CTAB, no wheezing, no rales Cardiovascular: RR, NR, no murmur, no LE edema Abdomen: +BS, NT, ND Musculoskeletal: no clubbing / cyanosis. He has pain in the mid spine, but no tenderness to palpation Skin: no rashes, lesions, ulcers on exposed skin.  He has a well healed surgical scar on the right lower back Neurologic: CN 2-12 grossly intact. Sensation mildly decreased on the right lower extremity.  Strength 5/5 in all 4.  Psychiatric: Normal judgment and insight. Alert and oriented x 3. Normal mood.   Labs on Admission: I  have personally reviewed following labs and imaging studies  CBC: Recent Labs  Lab 09/28/21 0005  WBC 7.6  NEUTROABS 3.5  HGB 14.7  HCT 43.8  MCV 84.1  PLT 205    Basic Metabolic Panel: Recent Labs  Lab 09/28/21 0005  NA 137  K 4.2  CL 104  CO2 24  GLUCOSE 80  BUN 12  CREATININE 0.84  CALCIUM 9.1    GFR: CrCl cannot be calculated (Unknown ideal weight.).    Radiological Exams on Admission: MR LUMBAR SPINE WO CONTRAST  Result Date: 09/28/2021 CLINICAL DATA:  Initial evaluation for acute on chronic mid and lower back pain, ataxia. EXAM: MRI LUMBAR SPINE WITHOUT CONTRAST TECHNIQUE: Multiplanar, multisequence MR imaging of the lumbar spine was performed. No intravenous contrast was administered. COMPARISON:  Comparison made with previous MRI from 05/15/2020. FINDINGS: Segmentation: Vertebral body count is made from the counter sequence on corresponding thoracic spine MRI. Given this, there appear to be 6 lumbar type vertebral bodies, lowest of which will be labeled S1, with a well-formed S1-2 interspace. Please note that this vertebral body count differs from previous exam. Alignment: Trace 2-3 mm retrolisthesis of L4 on L5. Alignment otherwise normal with preservation of the normal lumbar lordosis. Vertebrae: Vertebral body height maintained without acute or chronic fracture. Bone marrow signal intensity within normal limits. No worrisome osseous lesions. Mild discogenic reactive endplate change present about the L4-5 and L5-S1 interspaces. No other abnormal marrow edema. Conus medullaris and cauda equina: Conus extends to the L2 level. Conus and cauda equina appear normal. Paraspinal and other soft tissues: Unremarkable. Disc levels: L1-2:  Unremarkable. L2-3:  Unremarkable. L3-4: Normal interspace. Minimal right greater than left facet spurring. No stenosis. L4-5: Degenerative intervertebral disc space narrowing with diffuse disc bulge and disc desiccation. Mild facet hypertrophy.  No significant spinal stenosis. Foramina remain patent. L5-S1: Degenerative intervertebral disc space narrowing with disc desiccation and reactive endplate spurring. Diffuse disc bulge, eccentric to the right, with associated right paracentral annular fissure. Previous right hemi laminectomy with micro discectomy. No residual or recurrent spinal stenosis. Mild to moderate right with mild left L5 foraminal stenosis, similar. S1-2: No significant disc bulge. Moderate bilateral facet hypertrophy. Conjoined S1 and S2 nerve roots on the right. No spinal stenosis. Foramina remain patent. IMPRESSION: 1. No acute or emergent finding within the lumbar spine. 2. Please note that vertebral body count on this exam is made from the dens on the counter sequence of the corresponding thoracic spine MRI. Given this, there appear to be 6 lumbar type vertebral  bodies, lowest of which is labeled S1, with a well-formed S1-2 interspace. This vertebral body count differs from previous exam, and careful correlation with numbering system on this exam recommended prior to any potential future intervention. 3. Prior right hemi laminectomy with micro discectomy at L5-S1 without recurrent spinal stenosis. Mild to moderate right worse than left L5 foraminal narrowing at this level is relatively similar. 4. Additional degenerative disc disease at L4-5 without significant stenosis. Electronically Signed   By: Rise Mu M.D.   On: 09/28/2021 04:28   MR THORACIC SPINE WO CONTRAST  Result Date: 09/28/2021 CLINICAL DATA:  Initial evaluation for acute on chronic mid back pain, ataxia. EXAM: MRI THORACIC SPINE WITHOUT CONTRAST TECHNIQUE: Multiplanar, multisequence MR imaging of the thoracic spine was performed. No intravenous contrast was administered. COMPARISON:  Prior MRI from 05/15/2020. FINDINGS: Alignment:  Examination mildly degraded by motion artifact. Vertebral bodies normally aligned with preservation of the normal thoracic  kyphosis. No listhesis. Underlying mild scoliosis noted. Vertebrae: Chronic compression deformities with endplate Schmorl's nodes present at the superior endplates of T7 and T11, stable. No significant associated edema on today's exam. No acute or interval fracture. Bone marrow signal intensity within normal limits. No worrisome osseous lesions or abnormal marrow edema. Cord: Normal signal morphology. No convincing cord signal changes on this motion degraded exam. Paraspinal and other soft tissues: Paraspinous soft tissues within normal limits. Approximate 2 cm simple cyst noted within the subcapsular right hepatic lobe. Visualized visceral structures otherwise unremarkable. Disc levels: T1-2:  Unremarkable. T2-3: Unremarkable. T3-4:  Minimal left eccentric disc bulge.  No stenosis. T4-5: Right paracentral disc protrusion with annular fissure mildly indents the right ventral thecal sac. Prominence of the dorsal epidural fat. No significant spinal stenosis. Foramina remain patent. T5-6: Small right paracentral disc protrusion. Prominence of the dorsal epidural fat. No significant stenosis. T6-7: Right paracentral disc protrusion with slight superior migration. Mild flattening of the right ventral cord. Prominence of the dorsal epidural fat with borderline mild spinal stenosis. Foramina remain patent. T7-8: Right paracentral disc protrusion with both superior and inferior migration. Flattening of the right cord without cord signal changes. Prominence of the dorsal epidural fat. Mild spinal stenosis. Foramina remain patent. T8-9: Right paracentral disc protrusion indents the right ventral thecal sac. Slight superior and inferior migration. Flattening of the right cord without cord signal changes. Prominence of the dorsal epidural fat. Moderate spinal stenosis. Foramina remain patent. T9-10: Left paracentral disc extrusion contacts and flattens the left hemicord. No visible cord signal changes. Moderate spinal stenosis.  Foramina remain patent. T10-11: Disc bulge with reactive endplate spurring. Superimposed right paracentral disc protrusion contacts and mildly flattens the right ventral cord. No cord signal changes. Bilateral facet hypertrophy. No significant foraminal stenosis. T11-12: Chronic endplate Schmorl's node deformity without disc bulge. Bilateral facet hypertrophy. Mild right foraminal stenosis. Left neural foramen remains patent. T12-L1: Left paracentral disc protrusion with superior migration. Protruding disc contacts and mildly flattens the ventral cord. No cord signal changes. Bilateral facet hypertrophy. Mild spinal stenosis. Foramina remain patent. IMPRESSION: 1. Multifocal disc protrusions involving the T4-5 through T12-L1 levels with secondary cord flattening at multiple levels as above. No visible cord signal changes. Associated up to moderate spinal stenosis, most pronounced at T8-9 and T9-10. 2. Chronic superior endplate compression fractures with endplate Schmorl's node deformities at T7 and T11. 3. No other acute or emergent finding. Electronically Signed   By: Rise Mu M.D.   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   History of urinary retention - Tamsulosin recently started, continued -  Monitor for acute urinary retention - I/O   DVT prophylaxis: SCDs  Code Status:   Full  Disposition Plan:   Patient is from:  Home  Anticipated DC to:  Home  Anticipated DC date:  09/29/21  Anticipated DC barriers: Pain control  Consults called:  NSG, Dr. Julien Girt  Admission status:  Obs, Med Surg   Severity of Illness: The appropriate patient status for this patient is INPATIENT. Inpatient status is judged to be reasonable and necessary in order to provide the required intensity of service to ensure the patient's safety. The patient's presenting symptoms, physical exam findings, and initial radiographic and laboratory data in the context of their chronic comorbidities is felt to place them at high risk for further clinical deterioration. Furthermore, it is not anticipated that the patient will be medically stable for discharge from the hospital within 2 midnights of admission.   * I certify that at the point of admission it is my clinical judgment that the patient will require inpatient hospital care spanning beyond 2 midnights from the point of admission due to high intensity of service, high risk for further deterioration and high frequency of surveillance required.Debe Coder MD [facility]  How to contact the [facility] Attending or Consulting provider 7A - 7P or covering provider during after hours 7P -7A, for this patient?   Check the care team in [facility] and look for a) attending/consulting [facility] provider listed and b) the [facility] team listed Log into [email or web address] and use [place]'s universal password to access. If you do not have the password, please contact the hospital operator. Locate the [facility] provider you are looking for under [facility] and page to a number that you can be directly reached. If you still have difficulty reaching the provider, please page the [facility] (Director on Call) for the Hospitalists listed on amion for assistance.  09/28/2021, 2:17 PM

## 2021-09-29 DIAGNOSIS — Z8249 Family history of ischemic heart disease and other diseases of the circulatory system: Secondary | ICD-10-CM | POA: Diagnosis not present

## 2021-09-29 DIAGNOSIS — M549 Dorsalgia, unspecified: Secondary | ICD-10-CM | POA: Diagnosis present

## 2021-09-29 DIAGNOSIS — E7849 Other hyperlipidemia: Secondary | ICD-10-CM

## 2021-09-29 DIAGNOSIS — M4804 Spinal stenosis, thoracic region: Secondary | ICD-10-CM

## 2021-09-29 DIAGNOSIS — Z8774 Personal history of (corrected) congenital malformations of heart and circulatory system: Secondary | ICD-10-CM | POA: Diagnosis not present

## 2021-09-29 DIAGNOSIS — Z882 Allergy status to sulfonamides status: Secondary | ICD-10-CM | POA: Diagnosis not present

## 2021-09-29 DIAGNOSIS — H9190 Unspecified hearing loss, unspecified ear: Secondary | ICD-10-CM | POA: Diagnosis present

## 2021-09-29 DIAGNOSIS — Z79890 Hormone replacement therapy (postmenopausal): Secondary | ICD-10-CM | POA: Diagnosis not present

## 2021-09-29 DIAGNOSIS — I10 Essential (primary) hypertension: Secondary | ICD-10-CM

## 2021-09-29 DIAGNOSIS — Z885 Allergy status to narcotic agent status: Secondary | ICD-10-CM | POA: Diagnosis not present

## 2021-09-29 DIAGNOSIS — Z833 Family history of diabetes mellitus: Secondary | ICD-10-CM | POA: Diagnosis not present

## 2021-09-29 DIAGNOSIS — R52 Pain, unspecified: Secondary | ICD-10-CM | POA: Diagnosis present

## 2021-09-29 DIAGNOSIS — E063 Autoimmune thyroiditis: Secondary | ICD-10-CM

## 2021-09-29 DIAGNOSIS — M5126 Other intervertebral disc displacement, lumbar region: Secondary | ICD-10-CM | POA: Diagnosis present

## 2021-09-29 DIAGNOSIS — K219 Gastro-esophageal reflux disease without esophagitis: Secondary | ICD-10-CM | POA: Diagnosis present

## 2021-09-29 DIAGNOSIS — E669 Obesity, unspecified: Secondary | ICD-10-CM | POA: Diagnosis present

## 2021-09-29 DIAGNOSIS — Z8349 Family history of other endocrine, nutritional and metabolic diseases: Secondary | ICD-10-CM | POA: Diagnosis not present

## 2021-09-29 DIAGNOSIS — G902 Horner's syndrome: Secondary | ICD-10-CM | POA: Diagnosis present

## 2021-09-29 DIAGNOSIS — M4800 Spinal stenosis, site unspecified: Secondary | ICD-10-CM

## 2021-09-29 DIAGNOSIS — Z6836 Body mass index (BMI) 36.0-36.9, adult: Secondary | ICD-10-CM | POA: Diagnosis not present

## 2021-09-29 DIAGNOSIS — M5124 Other intervertebral disc displacement, thoracic region: Secondary | ICD-10-CM | POA: Diagnosis present

## 2021-09-29 DIAGNOSIS — F419 Anxiety disorder, unspecified: Secondary | ICD-10-CM | POA: Diagnosis present

## 2021-09-29 DIAGNOSIS — J45909 Unspecified asthma, uncomplicated: Secondary | ICD-10-CM | POA: Diagnosis present

## 2021-09-29 DIAGNOSIS — Z83438 Family history of other disorder of lipoprotein metabolism and other lipidemia: Secondary | ICD-10-CM | POA: Diagnosis not present

## 2021-09-29 DIAGNOSIS — M48061 Spinal stenosis, lumbar region without neurogenic claudication: Secondary | ICD-10-CM | POA: Diagnosis present

## 2021-09-29 DIAGNOSIS — Z79899 Other long term (current) drug therapy: Secondary | ICD-10-CM | POA: Diagnosis not present

## 2021-09-29 DIAGNOSIS — Z881 Allergy status to other antibiotic agents status: Secondary | ICD-10-CM | POA: Diagnosis not present

## 2021-09-29 LAB — BASIC METABOLIC PANEL
Anion gap: 8 (ref 5–15)
BUN: 12 mg/dL (ref 6–20)
CO2: 24 mmol/L (ref 22–32)
Calcium: 9.2 mg/dL (ref 8.9–10.3)
Chloride: 105 mmol/L (ref 98–111)
Creatinine, Ser: 1 mg/dL (ref 0.61–1.24)
GFR, Estimated: >60 mL/min (ref >60)
Glucose, Bld: 250 mg/dL — ABNORMAL HIGH (ref 70–99)
Potassium: 4.5 mmol/L (ref 3.5–5.1)
Sodium: 137 mmol/L (ref 135–145)

## 2021-09-29 LAB — TSH: TSH: 0.757 u[IU]/mL (ref 0.350–4.500)

## 2021-09-29 LAB — CBC
HCT: 42.1 % (ref 39.0–52.0)
Hemoglobin: 14.3 g/dL (ref 13.0–17.0)
MCH: 28.9 pg (ref 26.0–34.0)
MCHC: 34 g/dL (ref 30.0–36.0)
MCV: 85.2 fL (ref 80.0–100.0)
Platelets: 203 10*3/uL (ref 150–400)
RBC: 4.94 MIL/uL (ref 4.22–5.81)
RDW: 13.1 % (ref 11.5–15.5)
WBC: 13.3 10*3/uL — ABNORMAL HIGH (ref 4.0–10.5)
nRBC: 0 % (ref 0.0–0.2)

## 2021-09-29 MED ORDER — KETOROLAC TROMETHAMINE 30 MG/ML IJ SOLN
30.0000 mg | Freq: Four times a day (QID) | INTRAMUSCULAR | Status: DC
  Administered 2021-09-29 – 2021-10-01 (×9): 30 mg via INTRAVENOUS
  Filled 2021-09-29 (×9): qty 1

## 2021-09-29 MED ORDER — METHYLPREDNISOLONE 4 MG PO TBPK: 8.0000 mg | ORAL_TABLET | Freq: Every evening | ORAL | Status: DC

## 2021-09-29 MED ORDER — METHYLPREDNISOLONE 4 MG PO TBPK: 4.0000 mg | ORAL_TABLET | Freq: Four times a day (QID) | ORAL | Status: DC

## 2021-09-29 MED ORDER — HYDROMORPHONE HCL 2 MG PO TABS
3.0000 mg | ORAL_TABLET | ORAL | Status: DC | PRN
  Administered 2021-09-29 – 2021-09-30 (×3): 3 mg via ORAL
  Filled 2021-09-29 (×3): qty 2

## 2021-09-29 MED ORDER — METHYLPREDNISOLONE 4 MG PO TBPK: 4.0000 mg | ORAL_TABLET | ORAL | Status: DC

## 2021-09-29 MED ORDER — MAGNESIUM OXIDE -MG SUPPLEMENT 400 (240 MG) MG PO TABS
400.0000 mg | ORAL_TABLET | Freq: Two times a day (BID) | ORAL | Status: DC
  Administered 2021-09-29 – 2021-10-01 (×5): 400 mg via ORAL
  Filled 2021-09-29 (×5): qty 1

## 2021-09-29 MED ORDER — METHYLPREDNISOLONE 4 MG PO TBPK
8.0000 mg | ORAL_TABLET | Freq: Every morning | ORAL | Status: AC
  Filled 2021-09-29: qty 21

## 2021-09-29 MED ORDER — CYCLOBENZAPRINE HCL 5 MG PO TABS
5.0000 mg | ORAL_TABLET | Freq: Three times a day (TID) | ORAL | Status: DC | PRN
  Administered 2021-09-29 – 2021-09-30 (×2): 5 mg via ORAL
  Filled 2021-09-29 (×2): qty 1

## 2021-09-29 MED ORDER — METHYLPREDNISOLONE 4 MG PO TBPK: 4.0000 mg | ORAL_TABLET | Freq: Three times a day (TID) | ORAL | Status: DC

## 2021-09-29 MED ORDER — METHYLPREDNISOLONE 4 MG PO TBPK: 4.0000 mg | ORAL_TABLET | ORAL | Status: AC

## 2021-09-29 MED ORDER — LIDOCAINE 5 % EX PTCH
1.0000 | MEDICATED_PATCH | CUTANEOUS | Status: DC
  Administered 2021-09-29 – 2021-10-01 (×3): 1 via TRANSDERMAL
  Filled 2021-09-29 (×3): qty 1

## 2021-09-29 MED ORDER — PANTOPRAZOLE SODIUM 40 MG PO TBEC
40.0000 mg | DELAYED_RELEASE_TABLET | Freq: Two times a day (BID) | ORAL | Status: DC
  Administered 2021-09-29 – 2021-10-01 (×5): 40 mg via ORAL
  Filled 2021-09-29 (×5): qty 1

## 2021-09-29 MED ORDER — METHYLPREDNISOLONE 4 MG PO TBPK
8.0000 mg | ORAL_TABLET | Freq: Every morning | ORAL | Status: DC
  Filled 2021-09-29: qty 21

## 2021-09-29 MED ORDER — METHYLPREDNISOLONE 4 MG PO TBPK
8.0000 mg | ORAL_TABLET | Freq: Every evening | ORAL | Status: AC
  Administered 2021-09-30: 8 mg via ORAL

## 2021-09-29 MED ORDER — METHYLPREDNISOLONE 4 MG PO TBPK
4.0000 mg | ORAL_TABLET | Freq: Four times a day (QID) | ORAL | Status: DC
  Administered 2021-10-01 (×2): 4 mg via ORAL

## 2021-09-29 MED ORDER — METHYLPREDNISOLONE 4 MG PO TBPK
8.0000 mg | ORAL_TABLET | Freq: Every evening | ORAL | Status: AC
  Administered 2021-09-29: 8 mg via ORAL

## 2021-09-29 MED ORDER — METHYLPREDNISOLONE 4 MG PO TBPK
4.0000 mg | ORAL_TABLET | Freq: Three times a day (TID) | ORAL | Status: AC
  Administered 2021-09-30 (×3): 4 mg via ORAL

## 2021-09-29 MED ORDER — METHYLPREDNISOLONE 4 MG PO TBPK: 8.0000 mg | ORAL_TABLET | Freq: Every morning | ORAL | Status: DC

## 2021-09-29 NOTE — Hospital Course (Addendum)
Albert Watson is a 31 y.o. male with past medical history significant of asthma, GERD, goiter, HTN, hypothyroidism, spinal stenosis presented to the hospital with worsening back pain and numbness in the leg.  Patient reported that he was supposed to have neurosurgical intervention as outpatient and is on methadone and oxycodone for breakthrough pain including Lyrica at home but has been having worsening pain and numbness so he decided to come to the hospital.  No urinary or bowel incontinence.  Patient stated that oxycodone was not helping much and requesting different medications for pain.  In the ED, blood work was normal.  MRI of the spine was performed and patient was admitted hospital for further evaluation and treatment  Following conditions were addressed during hospitalization,  Spinal stenosis of thoracic region, L5 foraminal narrowing Intractable back pain. Pain has improved at this time.  Neurosurgery was notified and recommended outpatient follow-up for neurosurgical intervention.  Patient was optimized on pain medication regimen during hospitalization.  He was on oxycodone 30 mg every 4 hours as outpatient but was not getting much relief so this was converted to Dilaudid orally during hospitalization.  He was also increased dose on Mobic.  Lidoderm patch muscle relaxant and Medrol Dosepak was also initiated and will be continued on discharge for better pain relief.  Patient was also seen by physical therapy who recommend outpatient physical therapy on discharge.  Patient was advised to follow-up with his primary care physician for pain management on an ongoing basis.   Anxiety Continue bupropion and home hydroxyzine   Essential hypertension, benign Continue lisinopril from home.    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   Hypothyroidism, acquired, autoimmune TSH level of 0.7 at this time and within  normal limits.  Not taking any medications at home.  History of urinary retention Continue tamsulosin from outpatient

## 2021-09-29 NOTE — Progress Notes (Signed)
PROGRESS NOTE    Albert Watson  FUX:[id number] DOB: 03/05/1991 DOA: 09/27/2021 PCP: Patient, No Pcp Per    Brief Narrative:  Albert Watson is a 31 y.o. male with past medical history significant of asthma, GERD, goiter, HTN, hypothyroidism, spinal stenosis presented to hospital with worsening back pain and numbness in the leg.  Patient reported that he was supposed to have neurosurgical intervention as outpatient and is on methadone and oxycodone for breakthrough pain including Lyrica at home but has been having worsening pain and numbness so he decided to come to the hospital.  No urinary or bowel incontinence.  He stated that oxycodone was not helping much and requesting different medications for pain.  In the ED, blood work was normal.  MRI of the spine was performed and patient was admitted hospital for further evaluation and treatment  Assessment and plan  Principal Problem:   Spinal stenosis of thoracic region Active Problems:   Essential hypertension, benign   Status post surgery for complex congenital heart disease   Familial combined hyperlipidemia   Hypothyroidism, acquired, autoimmune   Spinal stenosis   Intractable pain   Spinal stenosis of thoracic region, L5 foraminal narrowing Intractable back pain. Neurosurgery was notified and recommend outpatient follow-up for neurosurgical intervention.  Continue pain management at this time.  We will continue with Dilaudid IV and as needed add Toradol, Lidoderm patch.  We will add p.o. Dilaudid since patient was taking oxycodone 30 mg every 4 hourly outpatient.  We will will increase Dilaudid dose to 3 mg p.o. every 4 hourly for now.  We will add muscle relaxant and Medrol Dosepak.  Continue Lyrica and duloxetine.  Communicated with the patient regarding goals of pain management.  Likely patient has high tolerance to pain at this time.  Physical therapy has seen the patient at this time and recommend outpatient PT on discharge.    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   Hypothyroidism, acquired, autoimmune TSH level of 0.7 at this time and within normal limits.  Not taking any medications at home.  History of urinary retention Thinly tamsulosin from outpatient     DVT prophylaxis: SCDs Start: 09/28/21 1014 SCDs Start: 09/28/21 0254   Code Status:     Code Status: Full Code  Disposition: Home likely in 1 to 2 days  Status is: Inpatient  Remains inpatient appropriate because: Intractable pain, requiring IV narcotics and multiple pain medication.   Family Communication: Communicated with the patient's wife at bedside  Consultants:  Neuro surgery was notified from the ED  Procedures:  None  Antimicrobials:  None  Anti-infectives (From admission, onward)    None       Subjective: Today, patient was seen and examined at bedside.  Complains of intractable back pain not helped with current regimen.  Denies any shortness of breath chest pain palpitation  Objective: Vitals:   09/28/21 2019 09/29/21 0448 09/29/21 0742 09/29/21 1351  BP: (!) 145/79 (!) 101/59 116/67 137/78  Pulse: (!) 108 90 90 98  Resp: 18 16 16 18   Temp: 98.2 F (36.8 C) 98.8 F (37.1 C) 97.7 F (36.5 C) 97.8 F (36.6 C)  TempSrc: Oral Oral Oral Oral  SpO2: 98% 98% 100% 97%  Weight:  107 kg    Height:       No intake or output data in  the 24 hours ending 09/29/21 1629 Filed Weights   09/28/21 1521 09/28/21 1533 09/29/21 0448  Weight: 106.5 kg 106.5 kg 107 kg    Physical Examination: Body mass index is 36.95 kg/m.  General: Obese built, not in obvious distress HENT:   No scleral pallor or icterus noted. Oral mucosa is moist.  Chest:  Clear breath sounds.  Diminished breath sounds bilaterally. No crackles or wheezes.  CVS:  S1 &S2 heard. No murmur.  Regular rate and rhythm. Abdomen: Soft, nontender, nondistended.  Bowel sounds are heard.  Tenderness on the mid back.  Healed scar on the back. Extremities: No cyanosis, clubbing or edema.  Peripheral pulses are palpable. Psych: Alert, awake and oriented, normal mood CNS:  No cranial nerve deficits.  Power equal in all extremities.   Skin: Warm and dry.  No rashes noted.  Data Reviewed:   CBC: Recent Labs  Lab 09/28/21 0005 09/29/21 0042  WBC 7.6 13.3*  NEUTROABS 3.5  --   HGB 14.7 14.3  HCT 43.8 42.1  MCV 84.1 85.2  PLT 205 203    Basic Metabolic Panel: Recent Labs  Lab 09/28/21 0005 09/29/21 0042  NA 137 137  K 4.2 4.5  CL 104 105  CO2 24 24  GLUCOSE 80 250*  BUN 12 12  CREATININE 0.84 1.00  CALCIUM 9.1 9.2    Liver Function Tests: No results for input(s): "AST", "ALT", "ALKPHOS", "BILITOT", "PROT", "ALBUMIN" in the last 168 hours.   Radiology Studies: MR LUMBAR SPINE WO CONTRAST  Result Date: 09/28/2021 CLINICAL DATA:  Initial evaluation for acute on chronic mid and lower back pain, ataxia. EXAM: MRI LUMBAR SPINE WITHOUT CONTRAST TECHNIQUE: Multiplanar, multisequence MR imaging of the lumbar spine was performed. No intravenous contrast was administered. COMPARISON:  Comparison made with previous MRI from 05/15/2020. FINDINGS: Segmentation: Vertebral body count is made from the counter sequence on corresponding thoracic spine MRI. Given this, there appear to be 6 lumbar type vertebral bodies, lowest of which will be labeled S1, with a well-formed S1-2 interspace. Please note that this vertebral body count differs from previous exam. Alignment: Trace 2-3 mm retrolisthesis of L4 on L5. Alignment otherwise normal with preservation of the normal lumbar lordosis. Vertebrae: Vertebral body height maintained without acute or chronic fracture. Bone marrow signal intensity within normal limits. No worrisome osseous lesions. Mild discogenic reactive  endplate change present about the L4-5 and L5-S1 interspaces. No other abnormal marrow edema. Conus medullaris and cauda equina: Conus extends to the L2 level. Conus and cauda equina appear normal. Paraspinal and other soft tissues: Unremarkable. Disc levels: L1-2:  Unremarkable. L2-3:  Unremarkable. L3-4: Normal interspace. Minimal right greater than left facet spurring. No stenosis. L4-5: Degenerative intervertebral disc space narrowing with diffuse disc bulge and disc desiccation. Mild facet hypertrophy. No significant spinal stenosis. Foramina remain patent. L5-S1: Degenerative intervertebral disc space narrowing with disc desiccation and reactive endplate spurring. Diffuse disc bulge, eccentric to the right, with associated right paracentral annular fissure. Previous right hemi laminectomy with micro discectomy. No residual or recurrent spinal stenosis. Mild to moderate right with mild left L5 foraminal stenosis, similar. S1-2: No significant disc bulge. Moderate bilateral facet hypertrophy. Conjoined S1 and S2 nerve roots on the right. No spinal stenosis. Foramina remain patent. IMPRESSION: 1. No acute or emergent finding within the lumbar spine. 2. Please note that vertebral body count on this exam is made from the dens on the counter sequence of the corresponding thoracic spine MRI. Given this, there appear to  be 6 lumbar type vertebral bodies, lowest of which is labeled S1, with a well-formed S1-2 interspace. This vertebral body count differs from previous exam, and careful correlation with numbering system on this exam recommended prior to any potential future intervention. 3. Prior right hemi laminectomy with micro discectomy at L5-S1 without recurrent spinal stenosis. Mild to moderate right worse than left L5 foraminal narrowing at this level is relatively similar. 4. Additional degenerative disc disease at L4-5 without significant stenosis. Electronically Signed   By: Rise Mu M.D.   On:  09/28/2021 04:28   MR THORACIC SPINE WO CONTRAST  Result Date: 09/28/2021 CLINICAL DATA:  Initial evaluation for acute on chronic mid back pain, ataxia. EXAM: MRI THORACIC SPINE WITHOUT CONTRAST TECHNIQUE: Multiplanar, multisequence MR imaging of the thoracic spine was performed. No intravenous contrast was administered. COMPARISON:  Prior MRI from 05/15/2020. FINDINGS: Alignment:  Examination mildly degraded by motion artifact. Vertebral bodies normally aligned with preservation of the normal thoracic kyphosis. No listhesis. Underlying mild scoliosis noted. Vertebrae: Chronic compression deformities with endplate Schmorl's nodes present at the superior endplates of T7 and T11, stable. No significant associated edema on today's exam. No acute or interval fracture. Bone marrow signal intensity within normal limits. No worrisome osseous lesions or abnormal marrow edema. Cord: Normal signal morphology. No convincing cord signal changes on this motion degraded exam. Paraspinal and other soft tissues: Paraspinous soft tissues within normal limits. Approximate 2 cm simple cyst noted within the subcapsular right hepatic lobe. Visualized visceral structures otherwise unremarkable. Disc levels: T1-2:  Unremarkable. T2-3: Unremarkable. T3-4:  Minimal left eccentric disc bulge.  No stenosis. T4-5: Right paracentral disc protrusion with annular fissure mildly indents the right ventral thecal sac. Prominence of the dorsal epidural fat. No significant spinal stenosis. Foramina remain patent. T5-6: Small right paracentral disc protrusion. Prominence of the dorsal epidural fat. No significant stenosis. T6-7: Right paracentral disc protrusion with slight superior migration. Mild flattening of the right ventral cord. Prominence of the dorsal epidural fat with borderline mild spinal stenosis. Foramina remain patent. T7-8: Right paracentral disc protrusion with both superior and inferior migration. Flattening of the right cord  without cord signal changes. Prominence of the dorsal epidural fat. Mild spinal stenosis. Foramina remain patent. T8-9: Right paracentral disc protrusion indents the right ventral thecal sac. Slight superior and inferior migration. Flattening of the right cord without cord signal changes. Prominence of the dorsal epidural fat. Moderate spinal stenosis. Foramina remain patent. T9-10: Left paracentral disc extrusion contacts and flattens the left hemicord. No visible cord signal changes. Moderate spinal stenosis. Foramina remain patent. T10-11: Disc bulge with reactive endplate spurring. Superimposed right paracentral disc protrusion contacts and mildly flattens the right ventral cord. No cord signal changes. Bilateral facet hypertrophy. No significant foraminal stenosis. T11-12: Chronic endplate Schmorl's node deformity without disc bulge. Bilateral facet hypertrophy. Mild right foraminal stenosis. Left neural foramen remains patent. T12-L1: Left paracentral disc protrusion with superior migration. Protruding disc contacts and mildly flattens the ventral cord. No cord signal changes. Bilateral facet hypertrophy. Mild spinal stenosis. Foramina remain patent. IMPRESSION: 1. Multifocal disc protrusions involving the T4-5 through T12-L1 levels with secondary cord flattening at multiple levels as above. No visible cord signal changes. Associated up to moderate spinal stenosis, most pronounced at T8-9 and T9-10. 2. Chronic superior endplate compression fractures with endplate Schmorl's node deformities at T7 and T11. 3. No other acute or emergent finding. Electronically Signed   By: Rise Mu M.D.   On: 09/28/2021 04:12  LOS: 0 days    Joycelyn Das, MD [facility] Available via Epic secure chat 7am-7pm After these hours, please refer to coverage provider listed on [email or web address] 09/29/2021, 4:29 PM

## 2021-09-29 NOTE — Plan of Care (Signed)
  Problem: Education: Goal: Knowledge of General Education information will improve Description: Including pain rating scale, medication(s)/side effects and non-pharmacologic comfort measures Outcome: Progressing   Problem: Activity: Goal: Risk for activity intolerance will decrease Outcome: Progressing   Problem: Coping: Goal: Level of anxiety will decrease Outcome: Progressing   Problem: Elimination: Goal: Will not experience complications related to bowel motility Outcome: Progressing   Problem: Pain Managment: Goal: General experience of comfort will improve Outcome: Progressing   Problem: Safety: Goal: Ability to remain free from injury will improve Outcome: Progressing   

## 2021-09-29 NOTE — Plan of Care (Signed)
  Problem: Education: Goal: Knowledge of General Education information will improve Description: Including pain rating scale, medication(s)/side effects and non-pharmacologic comfort measures Outcome: Progressing   Problem: Nutrition: Goal: Adequate nutrition will be maintained Outcome: Progressing   

## 2021-09-30 DIAGNOSIS — M4804 Spinal stenosis, thoracic region: Secondary | ICD-10-CM | POA: Diagnosis not present

## 2021-09-30 DIAGNOSIS — E7849 Other hyperlipidemia: Secondary | ICD-10-CM | POA: Diagnosis not present

## 2021-09-30 DIAGNOSIS — I10 Essential (primary) hypertension: Secondary | ICD-10-CM | POA: Diagnosis not present

## 2021-09-30 DIAGNOSIS — E063 Autoimmune thyroiditis: Secondary | ICD-10-CM | POA: Diagnosis not present

## 2021-09-30 LAB — BASIC METABOLIC PANEL
Anion gap: 9 (ref 5–15)
BUN: 22 mg/dL — ABNORMAL HIGH (ref 6–20)
CO2: 21 mmol/L — ABNORMAL LOW (ref 22–32)
Calcium: 8.9 mg/dL (ref 8.9–10.3)
Chloride: 108 mmol/L (ref 98–111)
Creatinine, Ser: 0.89 mg/dL (ref 0.61–1.24)
GFR, Estimated: >60 mL/min (ref >60)
Glucose, Bld: 211 mg/dL — ABNORMAL HIGH (ref 70–99)
Potassium: 4.8 mmol/L (ref 3.5–5.1)
Sodium: 138 mmol/L (ref 135–145)

## 2021-09-30 LAB — CBC
HCT: 41.3 % (ref 39.0–52.0)
Hemoglobin: 13.9 g/dL (ref 13.0–17.0)
MCH: 29.1 pg (ref 26.0–34.0)
MCHC: 33.7 g/dL (ref 30.0–36.0)
MCV: 86.6 fL (ref 80.0–100.0)
Platelets: 172 10*3/uL (ref 150–400)
RBC: 4.77 MIL/uL (ref 4.22–5.81)
RDW: 13.3 % (ref 11.5–15.5)
WBC: 9.4 10*3/uL (ref 4.0–10.5)
nRBC: 0 % (ref 0.0–0.2)

## 2021-09-30 LAB — GLUCOSE, CAPILLARY: Glucose-Capillary: 348 mg/dL — ABNORMAL HIGH (ref 70–99)

## 2021-09-30 MED ORDER — HYDROMORPHONE HCL 2 MG PO TABS
4.0000 mg | ORAL_TABLET | ORAL | Status: DC | PRN
  Administered 2021-09-30 – 2021-10-01 (×7): 4 mg via ORAL
  Filled 2021-09-30 (×7): qty 2

## 2021-09-30 NOTE — Progress Notes (Signed)
PROGRESS NOTE    Albert Watson  GYJ:[id number] DOB: [birth year] DOA: 09/27/2021 PCP: Patient, No Pcp Per    Brief Narrative:  Albert Watson is a 31 y.o. male with past medical history significant of asthma, GERD, goiter, HTN, hypothyroidism, spinal stenosis presented to the hospital with worsening back pain and numbness in the leg.  Patient reported that he was supposed to have neurosurgical intervention as outpatient and is on methadone and oxycodone for breakthrough pain including Lyrica at home but has been having worsening pain and numbness so he decided to come to the hospital.  No urinary or bowel incontinence.  Patient stated that oxycodone was not helping much and requesting different medications for pain.  In the ED, blood work was normal.  MRI of the spine was performed and patient was admitted hospital for further evaluation and treatment  Assessment and plan  Principal Problem:   Spinal stenosis of thoracic region Active Problems:   Essential hypertension, benign   Status post surgery for complex congenital heart disease   Familial combined hyperlipidemia   Hypothyroidism, acquired, autoimmune   Spinal stenosis   Intractable pain   Spinal stenosis of thoracic region, L5 foraminal narrowing Intractable back pain. Neurosurgery was notified and recommend outpatient follow-up for neurosurgical intervention.  Continue pain management at this time.  Patient is on Dilaudid IV as needed, IV Toradol, Lidoderm patch muscle relaxants and Medrol Dosepak at this time.  Feels a little better today and is willing to try oral Dilaudid and discontinue IV Dilaudid.  Will increase Dilaudid dose to 4 mg every 4 hourly.  Continue Lyrica and duloxetine.   Physical therapy has seen the patient at this time and recommend outpatient PT on discharge.   Anxiety Continue bupropion and home hydroxyzine   Essential hypertension, benign Continue lisinopril    Status post surgery for complex  congenital heart disease Familial combined hyperlipidemia Will need outpatient follow-up.   Hypothyroidism, acquired, autoimmune TSH level of 0.7 at this time and within normal limits.  Not taking any medications at home.  History of urinary retention Continue tamsulosin from outpatient     DVT prophylaxis: SCDs Start: 09/28/21 1014 SCDs Start: 09/28/21 2637   Code Status:     Code Status: Full Code  Disposition: Home likely on 10/01/2021 if pain is better controlled.  Status is: Inpatient  Remains inpatient appropriate because: Intractable pain,IV narcotics and multiple pain medication.   Family Communication:  I again communicated with the patient's wife at bedside  Consultants:  Neurosurgery was notified from the ED  Procedures:  None  Antimicrobials:  None  Anti-infectives (From admission, onward)    None       Subjective: Today, patient was seen and examined at bedside.  Complains of pain but little better than yesterday.  Inquiring about physical therapy.  Objective: Vitals:   09/29/21 2020 09/30/21 0500 09/30/21 0638 09/30/21 0819  BP: 138/88  (!) 142/76 (!) 147/78  Pulse: 89  87 85  Resp: 18  18 18   Temp: 97.9 F (36.6 C)  98 F (36.7 C) 98.1 F (36.7 C)  TempSrc: Oral  Oral Oral  SpO2: 98%  96% 98%  Weight:  107.4 kg    Height:        Intake/Output Summary (Last 24 hours) at 09/30/2021 1153 Last data filed at 09/29/2021 1800 Gross per 24 hour  Intake 480 ml  Output --  Net 480 ml   Filed Weights   09/28/21 1533 09/29/21 0448 09/30/21  0500  Weight: 106.5 kg 107 kg 107.4 kg    Physical Examination: Body mass index is 37.08 kg/m.   General: Obese built, not in obvious distress HENT:   No scleral pallor or icterus noted. Oral mucosa is moist.  Chest:  Clear breath sounds.  Diminished breath sounds bilaterally. No crackles or wheezes.  CVS: S1 &S2 heard. No murmur.  Regular rate and rhythm.   Abdomen: Soft, nontender, nondistended.  Bowel  sounds are heard.   Extremities: No cyanosis, clubbing or edema.  Peripheral pulses are palpable. Psych: Alert, awake and oriented, normal mood CNS:  No cranial nerve deficits.  Power equal in all extremities.   Skin: Warm and dry.  No rashes noted.   Data Reviewed:   CBC: Recent Labs  Lab 09/28/21 0005 09/29/21 0042 09/30/21 0325  WBC 7.6 13.3* 9.4  NEUTROABS 3.5  --   --   HGB 14.7 14.3 13.9  HCT 43.8 42.1 41.3  MCV 84.1 85.2 86.6  PLT 205 203 172    Basic Metabolic Panel: Recent Labs  Lab 09/28/21 0005 09/29/21 0042 09/30/21 0325  NA 137 137 138  K 4.2 4.5 4.8  CL 104 105 108  CO2 24 24 21*  GLUCOSE 80 250* 211*  BUN 12 12 22*  CREATININE 0.84 1.00 0.89  CALCIUM 9.1 9.2 8.9    Liver Function Tests: No results for input(s): "AST", "ALT", "ALKPHOS", "BILITOT", "PROT", "ALBUMIN" in the last 168 hours.   Radiology Studies: No results found.    LOS: 1 day    Joycelyn Das, MD [facility] Available via Epic secure chat 7am-7pm After these hours, please refer to coverage provider listed on [email or web address] 09/30/2021, 11:53 AM

## 2021-09-30 NOTE — Progress Notes (Signed)
Physical Therapy Treatment Patient Details Name: Albert Watson MRN: [id number] DOB: 1991/03/26 Today's Date: 09/30/2021   History of Present Illness 31 y.o. male presents to [facility] hospital with increasing back pain and LE numbness/weakness. PMH includes asthma, GERD, goiter, HTN, hypothyroidism.    PT Comments    Mobilizing well today, ambulated 280 feet with rolling walker, navigated steps similar to home environment. Did not require physical assist with mobility. Recommended walker use at home due to reported spontaneous buckling of LEs. All questions answered. Adequate for d/c from PT standpoint once medically ready.     Recommendations for follow up therapy are one component of a multi-disciplinary discharge planning process, led by the attending physician.  Recommendations may be updated based on patient status, additional functional criteria and insurance authorization.  Follow Up Recommendations  Outpatient PT     Assistance Recommended at Discharge PRN  Patient can return home with the following Help with stairs or ramp for entrance;Assist for transportation   Equipment Recommendations  None recommended by PT    Recommendations for Other Services       Precautions / Restrictions Precautions Precautions: Fall Restrictions Weight Bearing Restrictions: No     Mobility  Bed Mobility               General bed mobility comments: in recliner    Transfers Overall transfer level: Modified independent Equipment used: Rolling walker (2 wheels)               General transfer comment: Performed safely, with some effort.    Ambulation/Gait Ambulation/Gait assistance: Modified independent (Device/Increase time) Gait Distance (Feet): 280 Feet Assistive device: Rolling walker (2 wheels) Gait Pattern/deviations: Step-through pattern Gait velocity: reduced     General Gait Details: Educated on use of 2WW, consider avoiding SPC due to hx of spontaneous falls and  reported weakness of LEs. No buckling noted with 2WW.   Stairs Stairs: Yes Stairs assistance: Supervision Stair Management: No rails, Backwards, Forwards, With walker Number of Stairs: 1 (x3) General stair comments: Educated on safe stair use with 2WW. No buckling noted. Performed safely this date.   Wheelchair Mobility    Modified Rankin (Stroke Patients Only)       Balance Overall balance assessment: Needs assistance Sitting-balance support: No upper extremity supported, Feet supported Sitting balance-Leahy Scale: Good     Standing balance support: No upper extremity supported Standing balance-Leahy Scale: Fair                              Cognition Arousal/Alertness: Awake/alert Behavior During Therapy: WFL for tasks assessed/performed Overall Cognitive Status: Within Functional Limits for tasks assessed                                          Exercises      General Comments General comments (skin integrity, edema, etc.): Educated on safety, symptom awareness. Home mobility and self care. Avoiding peripheralizing activities. Increase ambulation frequency, and avoid standing/walking to the point of failure.      Pertinent Vitals/Pain Pain Assessment Pain Assessment: Faces Faces Pain Scale: Hurts whole lot Pain Location: back Pain Descriptors / Indicators: Aching Pain Intervention(s): Monitored during session, Limited activity within patient's tolerance    Home Living  Prior Function            PT Goals (current goals can now be found in the care plan section) Acute Rehab PT Goals Patient Stated Goal: to return to independence and reduced pain PT Goal Formulation: With patient Time For Goal Achievement: 10/12/21 Potential to Achieve Goals: Good Progress towards PT goals: Progressing toward goals    Frequency    Min 3X/week      PT Plan Current plan remains appropriate     Co-evaluation              AM-PAC PT "6 Clicks" Mobility   Outcome Measure  Help needed turning from your back to your side while in a flat bed without using bedrails?: None Help needed moving from lying on your back to sitting on the side of a flat bed without using bedrails?: None Help needed moving to and from a bed to a chair (including a wheelchair)?: None Help needed standing up from a chair using your arms (e.g., wheelchair or bedside chair)?: None Help needed to walk in hospital room?: A Little Help needed climbing 3-5 steps with a railing? : A Little 6 Click Score: 22    End of Session Equipment Utilized During Treatment: Gait belt Activity Tolerance: Patient tolerated treatment well Patient left: in chair;with call bell/phone within reach;with family/visitor present   PT Visit Diagnosis: Other abnormalities of gait and mobility (R26.89);Other symptoms and signs involving the nervous system (R29.898)     Time: 4782-9562 PT Time Calculation (min) (ACUTE ONLY): 24 min  Charges:  $Gait Training: 8-22 mins $Self Care/Home Management: 8-22                     Kathlyn Sacramento, PT    [place] 09/30/2021, 10:52 AM

## 2021-09-30 NOTE — Plan of Care (Signed)

## 2021-10-01 DIAGNOSIS — R52 Pain, unspecified: Secondary | ICD-10-CM | POA: Diagnosis not present

## 2021-10-01 DIAGNOSIS — I10 Essential (primary) hypertension: Secondary | ICD-10-CM | POA: Diagnosis not present

## 2021-10-01 DIAGNOSIS — E7849 Other hyperlipidemia: Secondary | ICD-10-CM | POA: Diagnosis not present

## 2021-10-01 DIAGNOSIS — M4804 Spinal stenosis, thoracic region: Secondary | ICD-10-CM | POA: Diagnosis not present

## 2021-10-01 MED ORDER — SENNOSIDES-DOCUSATE SODIUM 8.6-50 MG PO TABS: 1.0000 | ORAL_TABLET | Freq: Two times a day (BID) | ORAL | 0 refills | Status: AC

## 2021-10-01 MED ORDER — CYCLOBENZAPRINE HCL 5 MG PO TABS: 5.0000 mg | ORAL_TABLET | Freq: Three times a day (TID) | ORAL | 0 refills | Status: DC | PRN

## 2021-10-01 MED ORDER — MAGNESIUM OXIDE -MG SUPPLEMENT 400 (240 MG) MG PO TABS: 400.0000 mg | ORAL_TABLET | Freq: Two times a day (BID) | ORAL | 0 refills | Status: AC

## 2021-10-01 MED ORDER — HYDROMORPHONE HCL 4 MG PO TABS: 4.0000 mg | ORAL_TABLET | ORAL | 0 refills | Status: DC | PRN

## 2021-10-01 MED ORDER — PANTOPRAZOLE SODIUM 40 MG PO TBEC: 40.0000 mg | DELAYED_RELEASE_TABLET | Freq: Two times a day (BID) | ORAL | 0 refills | Status: AC

## 2021-10-01 MED ORDER — LIDOCAINE 5 % EX PTCH: 1.0000 | MEDICATED_PATCH | CUTANEOUS | 0 refills | Status: AC

## 2021-10-01 MED ORDER — MELOXICAM 15 MG PO TABS: 15.0000 mg | ORAL_TABLET | Freq: Every day | ORAL | 0 refills | Status: AC

## 2021-10-01 MED ORDER — METHYLPREDNISOLONE 4 MG PO TBPK: ORAL_TABLET | ORAL | 0 refills | Status: DC

## 2021-10-01 MED ORDER — BISACODYL 5 MG PO TBEC: 5.0000 mg | DELAYED_RELEASE_TABLET | Freq: Every day | ORAL | 0 refills | Status: AC | PRN

## 2021-10-01 NOTE — TOC Initial Note (Addendum)
Transition of Care [facility]) - Initial/Assessment Note    Patient Details  Name: HAADI SANTELLAN MRN: [id number] Date of Birth: October 24, 1990  Transition of [facility]) CM/SW Contact:    Kingsley Plan, RN Phone Number: 10/01/2021, 8:39 AM  Clinical Narrative:                 Confirmed face sheet information. Patient from home with wife.   PT recommendations for OP PT , no DME recommendations. Discussed with patient. Order placed for OP PT at [facility] location.   Patient's PCP is Landscape architect at [facility] Address: [address] [place], [place] [address] Phone: [phone]   Expected Discharge Plan: OP Rehab Barriers to Discharge: Continued Medical Work up   Patient Goals and CMS Choice Patient states their goals for this hospitalization and ongoing recovery are:: to return to home      Expected Discharge Plan and Services Expected Discharge Plan: OP Rehab       Living arrangements for the past 2 months: Single Family Home                 DME Arranged: N/A         HH Arranged: NA          Prior Living Arrangements/Services Living arrangements for the past 2 months: Single Family Home Lives with:: Spouse Patient language and need for interpreter reviewed:: Yes Do you feel safe going back to the place where you live?: Yes      Need for Family Participation in Patient Care: Yes (Comment) Care giver support system in place?: Yes (comment)   Criminal Activity/Legal Involvement Pertinent to Current Situation/Hospitalization: No - Comment as needed  Activities of Daily Living Home Assistive Devices/Equipment: Cane (specify quad or straight) ADL Screening (condition at time of admission) Patient's cognitive ability adequate to safely complete daily activities?: Yes Is the patient deaf or have difficulty hearing?: No Does the patient have difficulty seeing, even when wearing glasses/contacts?: No Does the patient have difficulty  concentrating, remembering, or making decisions?: No Patient able to express need for assistance with ADLs?: Yes Does the patient have difficulty dressing or bathing?: Yes Independently performs ADLs?: No Does the patient have difficulty walking or climbing stairs?: Yes Weakness of Legs: Both Weakness of Arms/Hands: Both  Permission Sought/Granted   Permission granted to share information with : No              Emotional Assessment Appearance:: Appears stated age Attitude/Demeanor/Rapport: Engaged Affect (typically observed): Accepting Orientation: : Oriented to Self, Oriented to Place, Oriented to  Time, Oriented to Situation Alcohol / Substance Use: Not Applicable Psych Involvement: No (comment)  Admission diagnosis:  Spinal stenosis of thoracic region [M48.04] Spinal stenosis [M48.00] Intractable pain [R52] Patient Active Problem List   Diagnosis Date Noted   Intractable pain 09/29/2021   Spinal stenosis of thoracic region 09/28/2021   Spinal stenosis 09/28/2021   Status post surgery for complex congenital heart disease    Familial combined hyperlipidemia    Goiter    Thyroiditis, autoimmune    Dyspepsia    Hypothyroidism, acquired, autoimmune    GERD (gastroesophageal reflux disease)    Hypertension    Asthma, chronic    Environmental allergies    Horner's syndrome    Hard of hearing    Obesity (BMI 30-39.9)    Essential hypertension, benign 08/23/2010   PCP:  Patient, No Pcp Per Pharmacy:   [facility] [phone] - [place],  [place] - [address] AT [facility] [address] [place] [address] Phone: [phone] Fax: [phone]  [facility] [address] [place] - [address] [place] [address] Phone: [phone] Fax: [phone]     Social Determinants of Health (SDOH) Interventions    Readmission Risk Interventions     No data to display

## 2021-10-01 NOTE — Plan of Care (Signed)
  Problem: Education: Goal: Knowledge of General Education information will improve Description: Including pain rating scale, medication(s)/side effects and non-pharmacologic comfort measures Outcome: Adequate for Discharge   Problem: Health Behavior/Discharge Planning: Goal: Ability to manage health-related needs will improve Outcome: Adequate for Discharge   Problem: Activity: Goal: Risk for activity intolerance will decrease Outcome: Adequate for Discharge   Problem: Nutrition: Goal: Adequate nutrition will be maintained Outcome: Adequate for Discharge   Problem: Coping: Goal: Level of anxiety will decrease Outcome: Adequate for Discharge   Problem: Elimination: Goal: Will not experience complications related to bowel motility Outcome: Adequate for Discharge Goal: Will not experience complications related to urinary retention Outcome: Adequate for Discharge   Problem: Pain Managment: Goal: General experience of comfort will improve Outcome: Adequate for Discharge   Problem: Safety: Goal: Ability to remain free from injury will improve Outcome: Adequate for Discharge   Problem: Skin Integrity: Goal: Risk for impaired skin integrity will decrease Outcome: Adequate for Discharge   

## 2021-10-01 NOTE — Discharge Summary (Signed)
Physician Discharge Summary   Patient: Albert Watson MRN: [id number] DOB: 09-08-1990  Admit date:     09/27/2021  Discharge date: 10/01/21  Discharge Physician: Joycelyn Das   PCP: Patient, No Pcp Per   Recommendations at discharge:   Follow-up with neurosurgery as outpatient for definite surgical management as has been scheduled. Follow-up with your primary care provider for pain management needs.  Discharge Diagnoses: Active Problems:   Essential hypertension, benign   Status post surgery for complex congenital heart disease   Familial combined hyperlipidemia   Hypothyroidism, acquired, autoimmune   Spinal stenosis of thoracic region   Spinal stenosis  Principal Problem (Resolved):   Intractable pain  Hospital Course: [name] is a 31 y.o. male with past medical history significant of asthma, GERD, goiter, HTN, hypothyroidism, spinal stenosis presented to the hospital with worsening back pain and numbness in the leg.  Patient reported that he was supposed to have neurosurgical intervention as outpatient and is on methadone and oxycodone for breakthrough pain including Lyrica at home but has been having worsening pain and numbness so he decided to come to the hospital.  No urinary or bowel incontinence.  Patient stated that oxycodone was not helping much and requesting different medications for pain.  In the ED, blood work was normal.  MRI of the spine was performed and patient was admitted hospital for further evaluation and treatment  Following conditions were addressed during hospitalization,  Spinal stenosis of thoracic region, L5 foraminal narrowing Intractable back pain. Pain has improved at this time.  Neurosurgery was notified and recommended outpatient follow-up for neurosurgical intervention.  Patient was optimized on pain medication regimen during hospitalization.  He was on oxycodone 30 mg every 4 hours as outpatient but was not getting much relief so this  was converted to Dilaudid orally during hospitalization.  He was also increased dose on Mobic.  Lidoderm patch muscle relaxant and Medrol Dosepak was also initiated and will be continued on discharge for better pain relief.  Patient was also seen by physical therapy who recommend outpatient physical therapy on discharge.  Patient was advised to follow-up with his primary care physician for pain management on an ongoing basis.   Anxiety Continue bupropion and home hydroxyzine   Essential hypertension, benign Continue lisinopril from home.    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   Hypothyroidism, acquired, autoimmune TSH level of 0.7 at this time and within normal limits.  Not taking any medications at home.  History of urinary retention Continue tamsulosin from outpatient   Consultants: None Procedures performed: MRI of the back Disposition: Home Diet recommendation:  Discharge Diet Orders (From admission, onward)     Start     Ordered   10/01/21 0000  Diet - low sodium heart healthy        10/01/21 0936           Cardiac diet DISCHARGE MEDICATION: Allergies as of 10/01/2021       Reactions   Codeine Hives   Hydrocodone-guaifenesin Nausea And Vomiting   Septra [bactrim] Hives   Sulfa Antibiotics Hives   Z-pak [azithromycin] Hives   Betadine [povidone-iodine] Rash   Penicillins Rash   Suprax [cefixime] Rash        Medication List     STOP taking these medications    levothyroxine 125 MCG tablet Commonly known as: SYNTHROID   oxyCODONE 15 MG immediate release tablet Commonly known as: ROXICODONE  TAKE these medications    acetaminophen 500 MG tablet Commonly known as: TYLENOL Take 1,000 mg by mouth every 6 (six) hours as needed for mild pain.   bisacodyl 5 MG EC tablet Commonly known as: DULCOLAX Take 1 tablet (5 mg total) by mouth daily as needed for moderate  constipation.   buPROPion 150 MG 12 hr tablet Commonly known as: ZYBAN Take 150 mg by mouth 2 (two) times daily.   cyclobenzaprine 5 MG tablet Commonly known as: FLEXERIL Take 1 tablet (5 mg total) by mouth 3 (three) times daily as needed for muscle spasms.   DULoxetine 60 MG capsule Commonly known as: CYMBALTA Take 60 mg by mouth daily.   DULoxetine 30 MG capsule Commonly known as: CYMBALTA Take 30 mg by mouth daily.   HYDROmorphone 4 MG tablet Commonly known as: DILAUDID Take 1 tablet (4 mg total) by mouth every 4 (four) hours as needed for moderate pain (alternate with IV).   hydrOXYzine 50 MG tablet Commonly known as: ATARAX Take 50 mg by mouth every 6 (six) hours as needed for anxiety.   lidocaine 5 % Commonly known as: LIDODERM Place 1 patch onto the skin daily. Remove & Discard patch within 12 hours or as directed by MD   lisinopril 20 MG tablet Commonly known as: ZESTRIL Take 20 mg by mouth 2 (two) times daily. What changed: Another medication with the same name was removed. Continue taking this medication, and follow the directions you see here.   magnesium oxide 400 (240 Mg) MG tablet Commonly known as: MAG-OX Take 1 tablet (400 mg total) by mouth 2 (two) times daily.   meloxicam 15 MG tablet Commonly known as: MOBIC Take 1 tablet (15 mg total) by mouth daily for 14 days. What changed:  medication strength how much to take   methadone 10 MG tablet Commonly known as: DOLOPHINE Take 20 mg by mouth 2 (two) times daily.   methylPREDNISolone 4 MG Tbpk tablet Commonly known as: MEDROL DOSEPAK follow package directions   OVER THE COUNTER MEDICATION Apply 1 Application topically daily as needed (back pain). CBD cream   pantoprazole 40 MG tablet Commonly known as: PROTONIX Take 1 tablet (40 mg total) by mouth 2 (two) times daily for 14 days.   pregabalin 75 MG capsule Commonly known as: LYRICA Take 75 mg by mouth in the morning, at noon, and at  bedtime.   senna-docusate 8.6-50 MG tablet Commonly known as: Senokot-S Take 1 tablet by mouth 2 (two) times daily for 14 days.   tamsulosin 0.4 MG Caps capsule Commonly known as: FLOMAX Take 0.4 mg by mouth daily.   VITAMIN C PO Take 1 tablet by mouth daily.        Follow-up Information     [facility]. Schedule an appointment as soon as possible for a visit.   Contact information: Sueanne MargaritaChasity Hammond  Address: [address] [place], [place] [address] Phone: [phone]               Subjective Today, patient was seen and examined at bedside.  Patient denies any nausea vomiting fever chills or rigor.  Pain is under better control  Discharge Exam: Filed Weights   09/28/21 1533 09/29/21 0448 09/30/21 0500  Weight: 106.5 kg 107 kg 107.4 kg      10/01/2021    7:59 AM 10/01/2021    4:11 AM 09/30/2021    7:45 PM  Vitals with BMI  Systolic 136 [id number]  Diastolic 76 90 78  Pulse 84 97 97    General:  Average built, not in obvious distress HENT:   No scleral pallor or icterus noted. Oral mucosa is moist.  Chest:  Clear breath sounds.  Diminished breath sounds bilaterally. No crackles or wheezes.  CVS: S1 &S2 heard. No murmur.  Regular rate and rhythm. Abdomen: Soft, nontender, nondistended.  Bowel sounds are heard.   Extremities: No cyanosis, clubbing or edema.  Peripheral pulses are palpable. Psych: Alert, awake and oriented, normal mood CNS:  No cranial nerve deficits.  Power equal in all extremities.   Skin: Warm and dry.  No rashes noted.   Condition at discharge: good  The results of significant diagnostics from this hospitalization (including imaging, microbiology, ancillary and laboratory) are listed below for reference.   Imaging Studies: MR LUMBAR SPINE WO CONTRAST  Result Date: 09/28/2021 CLINICAL DATA:  Initial evaluation for acute on chronic mid and lower back pain, ataxia. EXAM: MRI LUMBAR SPINE WITHOUT CONTRAST TECHNIQUE:  Multiplanar, multisequence MR imaging of the lumbar spine was performed. No intravenous contrast was administered. COMPARISON:  Comparison made with previous MRI from 05/15/2020. FINDINGS: Segmentation: Vertebral body count is made from the counter sequence on corresponding thoracic spine MRI. Given this, there appear to be 6 lumbar type vertebral bodies, lowest of which will be labeled S1, with a well-formed S1-2 interspace. Please note that this vertebral body count differs from previous exam. Alignment: Trace 2-3 mm retrolisthesis of L4 on L5. Alignment otherwise normal with preservation of the normal lumbar lordosis. Vertebrae: Vertebral body height maintained without acute or chronic fracture. Bone marrow signal intensity within normal limits. No worrisome osseous lesions. Mild discogenic reactive endplate change present about the L4-5 and L5-S1 interspaces. No other abnormal marrow edema. Conus medullaris and cauda equina: Conus extends to the L2 level. Conus and cauda equina appear normal. Paraspinal and other soft tissues: Unremarkable. Disc levels: L1-2:  Unremarkable. L2-3:  Unremarkable. L3-4: Normal interspace. Minimal right greater than left facet spurring. No stenosis. L4-5: Degenerative intervertebral disc space narrowing with diffuse disc bulge and disc desiccation. Mild facet hypertrophy. No significant spinal stenosis. Foramina remain patent. L5-S1: Degenerative intervertebral disc space narrowing with disc desiccation and reactive endplate spurring. Diffuse disc bulge, eccentric to the right, with associated right paracentral annular fissure. Previous right hemi laminectomy with micro discectomy. No residual or recurrent spinal stenosis. Mild to moderate right with mild left L5 foraminal stenosis, similar. S1-2: No significant disc bulge. Moderate bilateral facet hypertrophy. Conjoined S1 and S2 nerve roots on the right. No spinal stenosis. Foramina remain patent. IMPRESSION: 1. No acute or  emergent finding within the lumbar spine. 2. Please note that vertebral body count on this exam is made from the dens on the counter sequence of the corresponding thoracic spine MRI. Given this, there appear to be 6 lumbar type vertebral bodies, lowest of which is labeled S1, with a well-formed S1-2 interspace. This vertebral body count differs from previous exam, and careful correlation with numbering system on this exam recommended prior to any potential future intervention. 3. Prior right hemi laminectomy with micro discectomy at L5-S1 without recurrent spinal stenosis. Mild to moderate right worse than left L5 foraminal narrowing at this level is relatively similar. 4. Additional degenerative disc disease at L4-5 without significant stenosis. Electronically Signed   By: Rise Mu M.D.   On: 09/28/2021 04:28   MR THORACIC SPINE WO CONTRAST  Result Date: 09/28/2021 CLINICAL DATA:  Initial evaluation for acute on  chronic mid back pain, ataxia. EXAM: MRI THORACIC SPINE WITHOUT CONTRAST TECHNIQUE: Multiplanar, multisequence MR imaging of the thoracic spine was performed. No intravenous contrast was administered. COMPARISON:  Prior MRI from 05/15/2020. FINDINGS: Alignment:  Examination mildly degraded by motion artifact. Vertebral bodies normally aligned with preservation of the normal thoracic kyphosis. No listhesis. Underlying mild scoliosis noted. Vertebrae: Chronic compression deformities with endplate Schmorl's nodes present at the superior endplates of T7 and T11, stable. No significant associated edema on today's exam. No acute or interval fracture. Bone marrow signal intensity within normal limits. No worrisome osseous lesions or abnormal marrow edema. Cord: Normal signal morphology. No convincing cord signal changes on this motion degraded exam. Paraspinal and other soft tissues: Paraspinous soft tissues within normal limits. Approximate 2 cm simple cyst noted within the subcapsular right  hepatic lobe. Visualized visceral structures otherwise unremarkable. Disc levels: T1-2:  Unremarkable. T2-3: Unremarkable. T3-4:  Minimal left eccentric disc bulge.  No stenosis. T4-5: Right paracentral disc protrusion with annular fissure mildly indents the right ventral thecal sac. Prominence of the dorsal epidural fat. No significant spinal stenosis. Foramina remain patent. T5-6: Small right paracentral disc protrusion. Prominence of the dorsal epidural fat. No significant stenosis. T6-7: Right paracentral disc protrusion with slight superior migration. Mild flattening of the right ventral cord. Prominence of the dorsal epidural fat with borderline mild spinal stenosis. Foramina remain patent. T7-8: Right paracentral disc protrusion with both superior and inferior migration. Flattening of the right cord without cord signal changes. Prominence of the dorsal epidural fat. Mild spinal stenosis. Foramina remain patent. T8-9: Right paracentral disc protrusion indents the right ventral thecal sac. Slight superior and inferior migration. Flattening of the right cord without cord signal changes. Prominence of the dorsal epidural fat. Moderate spinal stenosis. Foramina remain patent. T9-10: Left paracentral disc extrusion contacts and flattens the left hemicord. No visible cord signal changes. Moderate spinal stenosis. Foramina remain patent. T10-11: Disc bulge with reactive endplate spurring. Superimposed right paracentral disc protrusion contacts and mildly flattens the right ventral cord. No cord signal changes. Bilateral facet hypertrophy. No significant foraminal stenosis. T11-12: Chronic endplate Schmorl's node deformity without disc bulge. Bilateral facet hypertrophy. Mild right foraminal stenosis. Left neural foramen remains patent. T12-L1: Left paracentral disc protrusion with superior migration. Protruding disc contacts and mildly flattens the ventral cord. No cord signal changes. Bilateral facet hypertrophy.  Mild spinal stenosis. Foramina remain patent. IMPRESSION: 1. Multifocal disc protrusions involving the T4-5 through T12-L1 levels with secondary cord flattening at multiple levels as above. No visible cord signal changes. Associated up to moderate spinal stenosis, most pronounced at T8-9 and T9-10. 2. Chronic superior endplate compression fractures with endplate Schmorl's node deformities at T7 and T11. 3. No other acute or emergent finding. Electronically Signed   By: Rise Mu M.D.   On: 09/28/2021 04:12    Microbiology: Results for orders placed or performed in visit on 04/19/19  Novel Coronavirus, NAA ([facility])     Status: None   Collection Time: 04/19/19 11:28 AM   Specimen: Nasopharyngeal(NP) swabs in vial transport medium   NASOPHARYNGE  TESTING  Result Value Ref Range Status   SARS-CoV-2, NAA Not Detected Not Detected Final    Comment: This nucleic acid amplification test was developed and its performance characteristics determined by [employer]. Nucleic acid amplification tests include RT-PCR and TMA. This test has not been FDA cleared or approved. This test has been authorized by FDA under an Emergency Use Authorization (EUA). This test is only authorized for  the duration of time the declaration that circumstances exist justifying the authorization of the emergency use of in vitro diagnostic tests for detection of SARS-CoV-2 virus and/or diagnosis of COVID-19 infection under section 564(b)(1) of the Act, 21 U.S.C. 161WRU-0(A) (1), unless the authorization is terminated or revoked sooner. When diagnostic testing is negative, the possibility of a false negative result should be considered in the context of a patient's recent exposures and the presence of clinical signs and symptoms consistent with COVID-19. An individual without symptoms of COVID-19 and who is not shedding SARS-CoV-2 virus wo uld expect to have a negative (not detected) result in this assay.      Labs: CBC: Recent Labs  Lab 09/28/21 0005 09/29/21 0042 09/30/21 0325  WBC 7.6 13.3* 9.4  NEUTROABS 3.5  --   --   HGB 14.7 14.3 13.9  HCT 43.8 42.1 41.3  MCV 84.1 85.2 86.6  PLT 205 203 172   Basic Metabolic Panel: Recent Labs  Lab 09/28/21 0005 09/29/21 0042 09/30/21 0325  NA 137 137 138  K 4.2 4.5 4.8  CL 104 105 108  CO2 24 24 21*  GLUCOSE 80 250* 211*  BUN 12 12 22*  CREATININE 0.84 1.00 0.89  CALCIUM 9.1 9.2 8.9   Liver Function Tests: No results for input(s): "AST", "ALT", "ALKPHOS", "BILITOT", "PROT", "ALBUMIN" in the last 168 hours. CBG: Recent Labs  Lab 09/30/21 1533  GLUCAP 348*    Discharge time spent: greater than 30 minutes.  Signed: Joycelyn Das, MD [facility] 10/01/2021

## 2021-10-01 NOTE — Progress Notes (Signed)
Discharge instructions with medication administration schedule given to patient, verbalized understanding. Alert and oriented x4 and will be discharged to spouse.

## 2021-10-08 NOTE — Pre-Procedure Instructions (Signed)
Surgical Instructions    Your procedure is scheduled on October 14, 2021.  Report to [facility] Main Entrance "A" at 5:30 A.M., then check in with the Admitting office.  Call this number if you have problems the morning of surgery:  [phone]   If you have any questions prior to your surgery date call [phone]: Open Monday-Friday 8am-4pm    Remember:  Do not eat after midnight the night before your surgery  You may drink clear liquids until 4:30 AM the morning of your surgery.   Clear liquids allowed are: Water, Non-Citrus Juices (without pulp), Carbonated Beverages, Clear Tea, Black Coffee Only (NO MILK, CREAM OR POWDERED CREAMER of any kind), and Gatorade.    Take these medicines the morning of surgery with A SIP OF WATER:  buPROPion (ZYBAN)   pantoprazole (PROTONIX)   pregabalin (LYRICA)  senna-docusate (SENOKOT-S)  tamsulosin (FLOMAX)  DULoxetine (CYMBALTA) methadone (DOLOPHINE) methylPREDNISolone (MEDROL DOSEPAK)    Take these medicines the morning of surgery AS NEEDED:  acetaminophen (TYLENOL)  cyclobenzaprine (FLEXERIL)  hydrOXYzine (ATARAX)  As of today, STOP taking any Aspirin (unless otherwise instructed by your surgeon) Aleve, Naproxen, Ibuprofen, Motrin, Advil, Goody's, BC's, all herbal medications, fish oil, and all vitamins. This includes your medication: meloxicam (MOBIC)                      Do NOT Smoke (Tobacco/Vaping) for 24 hours prior to your procedure.  If you use a CPAP at night, you may bring your mask/headgear for your overnight stay.   Contacts, glasses, piercing's, hearing aid's, dentures or partials may not be worn into surgery, please bring cases for these belongings.    For patients admitted to the hospital, discharge time will be determined by your treatment team.   Patients discharged the day of surgery will not be allowed to drive home, and someone needs to stay with them for 24 hours.  SURGICAL WAITING ROOM VISITATION Patients having  surgery or a procedure may have two support people in the waiting room. These visitors may be switched out with other visitors if needed. Children under the age of 78 must have an adult accompany them who is not the patient. If the patient needs to stay at the hospital during part of their recovery, the visitor guidelines for inpatient rooms apply.  Please refer to the [facility] website for the visitor guidelines for Inpatients (after your surgery is over and you are in a regular room).    Special instructions:   [place]- Preparing For Surgery  Before surgery, you can play an important role. Because skin is not sterile, your skin needs to be as free of germs as possible. You can reduce the number of germs on your skin by washing with CHG (chlorahexidine gluconate) Soap before surgery.  CHG is an antiseptic cleaner which kills germs and bonds with the skin to continue killing germs even after washing.    Oral Hygiene is also important to reduce your risk of infection.  Remember - BRUSH YOUR TEETH THE MORNING OF SURGERY WITH YOUR REGULAR TOOTHPASTE  Please do not use if you have an allergy to CHG or antibacterial soaps. If your skin becomes reddened/irritated stop using the CHG.  Do not shave (including legs and underarms) for at least 48 hours prior to first CHG shower. It is OK to shave your face.  Please follow these instructions carefully.   Shower the NIGHT BEFORE SURGERY and the MORNING OF SURGERY  If you  chose to wash your hair, wash your hair first as usual with your normal shampoo.  After you shampoo, rinse your hair and body thoroughly to remove the shampoo.  Use CHG Soap as you would any other liquid soap. You can apply CHG directly to the skin and wash gently with a scrungie or a clean washcloth.   Apply the CHG Soap to your body ONLY FROM THE NECK DOWN.  Do not use on open wounds or open sores. Avoid contact with your eyes, ears, mouth and genitals (private parts). Wash  Face and genitals (private parts)  with your normal soap.   Wash thoroughly, paying special attention to the area where your surgery will be performed.  Thoroughly rinse your body with warm water from the neck down.  DO NOT shower/wash with your normal soap after using and rinsing off the CHG Soap.  Pat yourself dry with a CLEAN TOWEL.  Wear CLEAN PAJAMAS to bed the night before surgery  Place CLEAN SHEETS on your bed the night before your surgery  DO NOT SLEEP WITH PETS.   Day of Surgery: Take a shower with CHG soap. Do not wear jewelry or makeup Do not wear lotions, powders, perfumes/colognes, or deodorant. Do not shave 48 hours prior to surgery.  Men may shave face and neck. Do not bring valuables to the hospital.  [facility] is not responsible for any belongings or valuables. Do not wear nail polish, gel polish, artificial nails, or any other type of covering on natural nails (fingers and toes) If you have artificial nails or gel coating that need to be removed by a nail salon, please have this removed prior to surgery. Artificial nails or gel coating may interfere with anesthesia's ability to adequately monitor your vital signs. Wear Clean/Comfortable clothing the morning of surgery Remember to brush your teeth WITH YOUR REGULAR TOOTHPASTE.   Please read over the following fact sheets that you were given.    If you received a COVID test during your pre-op visit  it is requested that you wear a mask when out in public, stay away from anyone that may not be feeling well and notify your surgeon if you develop symptoms. If you have been in contact with anyone that has tested positive in the last 10 days please notify you surgeon.

## 2021-10-09 ENCOUNTER — Encounter (HOSPITAL_COMMUNITY): Payer: Self-pay

## 2021-10-09 ENCOUNTER — Other Ambulatory Visit: Payer: Self-pay

## 2021-10-09 ENCOUNTER — Inpatient Hospital Stay (HOSPITAL_COMMUNITY)
Admission: RE | Admit: 2021-10-09 | Discharge: 2021-10-09 | Disposition: A | Discharge status: Home / Self Care | Payer: 59 | Source: Ambulatory Visit

## 2021-10-09 VITALS — Ht 67.0 in | Wt 236.8 lb

## 2021-10-09 DIAGNOSIS — Z01818 Encounter for other preprocedural examination: Secondary | ICD-10-CM

## 2021-10-09 HISTORY — DX: Depression, unspecified: F32.A

## 2021-10-09 HISTORY — DX: Anxiety disorder, unspecified: F41.9

## 2021-10-09 HISTORY — DX: Tetralogy of Fallot: Q21.3

## 2021-10-09 NOTE — Progress Notes (Signed)
Pt did not show up for PAT appt, said that he forgot. I went over all PAT information on the phone and gave pt all pre-surgical instructions. All questions answered. Pt instructed to call if he had any issues.

## 2021-10-09 NOTE — Progress Notes (Signed)
PCP - Sueanne Margarita, FNP Cardiologist - Dr. Rosiland Oz  PPM/ICD - denies   Chest x-ray - 02/12/21 Right ribs with chest 3 view EKG - 02/14/21 Stress Test - 10+ years ago, no abnormalities ECHO - 04/12/21 Cardiac Cath - denies  CPAP - denies  DM- denies  ASA/Blood Thinner Instructions:  n/a   ERAS Protcol - yes, no drink clears until 0430  COVID TEST- n/a  Anesthesia review: yes, cardiac hx  Patient verbally denies any shortness of breath, fever, cough and chest pain during phone call   -------------  SDW INSTRUCTIONS given:  Your procedure is scheduled on Monday, July 17th.  Report to [facility] Main Entrance "A" at 05:30 A.M., and check in at the Admitting office.  Call this number if you have problems the morning of surgery:  [phone]   Remember:  Do not eat after midnight the night before your surgery  You may drink clear liquids until 04:30 AM the morning of your surgery.   Clear liquids allowed are: Water, Non-Citrus Juices (without pulp), Carbonated Beverages, Clear Tea, Black Coffee Only, and Gatorade    Take these medicines the morning of surgery with A SIP OF WATER  buPROPion (ZYBAN)  pantoprazole (PROTONIX)  pregabalin (LYRICA) tamsulosin (FLOMAX) DULoxetine (CYMBALTA) methadone (DOLOPHINE)   If needed: acetaminophen (TYLENOL)  cyclobenzaprine (FLEXERIL)  hydrOXYzine (ATARAX) HYDROmorphone (DILAUDID)   As of today, STOP taking any Aspirin (unless otherwise instructed by your surgeon) Aleve, Naproxen, Ibuprofen, Motrin, Advil, Goody's, BC's, all herbal medications, fish oil, and all vitamins.                      Do not wear jewelry, make up, or nail polish            Do not wear lotions, powders, perfumes/colognes, or deodorant.            Do not shave 48 hours prior to surgery.  Men may shave face and neck.            Do not bring valuables to the hospital.            [facility] is not responsible for any belongings or valuables.  Do  NOT Smoke (Tobacco/Vaping) 24 hours prior to your procedure If you use a CPAP at night, you may bring all equipment for your overnight stay.   Contacts, glasses, dentures or bridgework may not be worn into surgery.      For patients admitted to the hospital, discharge time will be determined by your treatment team.   Patients discharged the day of surgery will not be allowed to drive home, and someone needs to stay with them for 24 hours.    Special instructions:   [place]- Preparing For Surgery  Before surgery, you can play an important role. Because skin is not sterile, your skin needs to be as free of germs as possible. You can reduce the number of germs on your skin by washing with CHG (chlorahexidine gluconate) Soap before surgery.  CHG is an antiseptic cleaner which kills germs and bonds with the skin to continue killing germs even after washing.    Oral Hygiene is also important to reduce your risk of infection.  Remember - BRUSH YOUR TEETH THE MORNING OF SURGERY WITH YOUR REGULAR TOOTHPASTE  Please do not use if you have an allergy to CHG or antibacterial soaps. If your skin becomes reddened/irritated stop using the CHG.  Do not shave (including legs and  underarms) for at least 48 hours prior to first CHG shower. It is OK to shave your face.  Please follow these instructions carefully.   Shower the NIGHT BEFORE SURGERY and the MORNING OF SURGERY with DIAL Soap.   Pat yourself dry with a CLEAN TOWEL.  Wear CLEAN PAJAMAS to bed the night before surgery  Place CLEAN SHEETS on your bed the night of your first shower and DO NOT SLEEP WITH PETS.   Day of Surgery: Please shower morning of surgery  Wear Clean/Comfortable clothing the morning of surgery Do not apply any deodorants/lotions.   Remember to brush your teeth WITH YOUR REGULAR TOOTHPASTE.   Questions were answered. Patient verbalized understanding of instructions.

## 2021-10-10 NOTE — Progress Notes (Signed)
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 Lana Fish, [facility]).  Last seen 05/24/2020 and at that time was noted to have excellent sustained long-term postoperative findings.  At that time was also discussed that he was having worsening pain secondary to spinal disc herniation.  Per note, "Albert Watson) has sustained excellent long-term surgical results following his Tetralogy of Fallot repair and revision; he is considered "cleared" from cardiovascular standpoint for upcoming back surgery. No limitations or restrictions on activities from the cardiovascular standpoint were recommended.  SBE prophylaxis at the time of dental work is not required.  At present I advise Pediatric Cardiology follow-up with me or at [facility] Adult Congenital Heart Disease clinic in 2 years, but we would be happy to see [name] sooner in the future if questions or concerns regarding the cardiovascular system arise."  Recent admission at [facility] 6/30 through 10/01/2021 for intractable back pain.  Pain improved with medication changes.  He is currently maintained on Dilaudid 4 mg every 4 hours as needed and methadone 10 mg daily.  BMP and CBC from 09/30/2021 during recent admission reviewed, unremarkable other than elevated glucose.  Notably, CBG 09/30/2021 was 348.  Patient does not have diagnosis of DM2.  EKG 02/14/2021 (Care Everywhere): Sinus tachycardia.  Rate 101.  Right bundle branch block.  Prolonged QT (QTc 505).  TTE 04/12/21 (care everywhere): INTERPRETATION ---------------------------------------------------------------    NORMAL LEFT VENTRICULAR SYSTOLIC FUNCTION WITH MILD LVH    NORMAL LA PRESSURES WITH NORMAL DIASTOLIC FUNCTION    NORMAL RIGHT VENTRICULAR SYSTOLIC FUNCTION    VALVULAR REGURGITATION: TRIVIAL PR, TRIVIAL TR    TRIVIAL PERICARDIAL EFFUSION     POOR SOUND TRANSMISSION    DILATED AORTIC SINUSES (3.9cm) AND ASCENDING AORTA (4.0cm)    TETRALOGY OF FALLOT S/P VSD REPAIR (1993) AND 8mm PULMONARY VALVE    HOMOGRAFT (2009); RIGHT AORTIC ARCH WITH MIRROR-IMAGE BRANCHING NOTED IN    PATIENT'S CHART BUT NOT WELL VISUALIZED ON TODAY'S EXAM.    NO PRIOR STUDY FOR COMPARISON   Peds congenital echo 05/24/20 (care everywhere): Summary:   1. Normal, right-sided superior vena cava.   2. Normal pulmonary veins.   3. Normal-size right atrium.   4. Normal-size left atrium.   5. Intact atrial septum.   6. Normal mitral valve.   7. Normal tricuspid valve.   8. Normal left ventricular cavity size and systolic function.   9. Normal right ventricular cavity size and systolic function.  10. No residual ventricular septal defect postoperatively.  11. No left ventricular outflow tract obstruction.  12. No right ventricular outflow tract obstruction.  13. Excellent long-term surgical result s/p Tetralogy of Fallot repair and revision with RV-PA homograft.  14. Normal peak flow velocity across pulmonary homograft, peak 1.3 m/sec, normal; trace insufficiciency present, stable, normal.  15. Normal main and branch pulmonary arteries.  16. Normal aortic valve.  17. Right aortic arch with mirror-image branching.  18. Aortic root is dilated, frequent finding in Tetralogy of Fallot.  19. No pericardial effusion    [facility] Short Stay Center/Anesthesiology Phone [phone] 10/10/2021 11:22 AM

## 2021-10-10 NOTE — Anesthesia Preprocedure Evaluation (Addendum)
Anesthesia Evaluation  Patient identified by MRN, date of birth, ID band Patient awake    Reviewed: Allergy & Precautions, NPO status , Patient's Chart, lab work & pertinent test results  Airway Mallampati: II  TM Distance: >3 FB Neck ROM: Full    Dental  (+) Poor Dentition, Chipped, Missing, Dental Advisory Given   Pulmonary asthma ,    breath sounds clear to auscultation       Cardiovascular hypertension,  Rhythm:Regular Rate:Normal  - s/p Tetralogy of Fallot repair   Neuro/Psych PSYCHIATRIC DISORDERS Anxiety Depression    GI/Hepatic Neg liver ROS, GERD  ,  Endo/Other  Hypothyroidism   Renal/GU negative Renal ROS     Musculoskeletal negative musculoskeletal ROS (+)   Abdominal Normal abdominal exam  (+)   Peds  Hematology   Anesthesia Other Findings   Reproductive/Obstetrics                           Anesthesia Physical Anesthesia Plan  ASA: 3  Anesthesia Plan: General   Post-op Pain Management:    Induction: Intravenous  PONV Risk Score and Plan: 3 and Ondansetron, Dexamethasone and Midazolam  Airway Management Planned: Oral ETT  Additional Equipment: None  Intra-op Plan:   Post-operative Plan: Extubation in OR  Informed Consent: I have reviewed the patients History and Physical, chart, labs and discussed the procedure including the risks, benefits and alternatives for the proposed anesthesia with the patient or authorized representative who has indicated his/her understanding and acceptance.     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 Lana Fish, [facility]).  Last seen 05/24/2020 and at that time was noted to have excellent  sustained long-term postoperative findings.  At that time was also discussed that he was having worsening pain secondary to spinal disc herniation.  Per note, "Jomarie Longs Richardson Dopp) has sustained excellent long-term surgical results following his Tetralogy of Fallot repair and revision; he is considered "cleared" from cardiovascular standpoint for upcoming back surgery. No limitations or restrictions on activities from the cardiovascular standpoint were recommended.  SBE prophylaxis at the time of dental work is not required.  At present I advise Pediatric Cardiology follow-up with me or at [facility] Adult Congenital Heart Disease clinic in 2 years, but we would be happy to see Alexio sooner in the future if questions or concerns regarding the cardiovascular system arise."  Recent admission at [facility] 6/30 through 10/01/2021 for intractable back pain.  Pain improved with medication changes.  He is currently maintained on Dilaudid 4 mg every 4 hours as needed and methadone 10 mg daily.  BMP and CBC from 09/30/2021 during recent admission reviewed, unremarkable other than elevated glucose.  Notably, CBG 09/30/2021 was 348.  Patient does not have diagnosis of DM2.  EKG 02/14/2021 (Care Everywhere): Sinus tachycardia.  Rate 101.  Right bundle branch block.  Prolonged QT (QTc 505).  TTE 04/12/21 (care everywhere): INTERPRETATION ---------------------------------------------------------------  NORMAL LEFT VENTRICULAR SYSTOLIC FUNCTION WITH MILD LVH  NORMAL LA PRESSURES WITH NORMAL DIASTOLIC FUNCTION  NORMAL RIGHT VENTRICULAR SYSTOLIC FUNCTION  VALVULAR REGURGITATION: TRIVIAL PR, TRIVIAL TR  TRIVIAL PERICARDIAL EFFUSION  POOR SOUND TRANSMISSION  DILATED AORTIC SINUSES (3.9cm) AND ASCENDING AORTA (4.0cm)  TETRALOGY OF FALLOT S/P VSD REPAIR (1993)  AND 76mm PULMONARY VALVE  HOMOGRAFT (2009); RIGHT AORTIC ARCH WITH MIRROR-IMAGE BRANCHING NOTED IN  PATIENT'S CHART BUT NOT WELL VISUALIZED ON TODAY'S EXAM.   NO PRIOR STUDY FOR COMPARISON   Peds congenital echo 05/24/20 (care everywhere): Summary:  1. Normal, right-sided superior vena cava.  2. Normal pulmonary veins.  3. Normal-size right atrium.  4. Normal-size left atrium.  5. Intact atrial septum.  6. Normal mitral valve.  7. Normal tricuspid valve.  8. Normal left ventricular cavity size and systolic function.  9. Normal right ventricular cavity size and systolic function.  10. No residual ventricular septal defect postoperatively.  11. No left ventricular outflow tract obstruction.  12. No right ventricular outflow tract obstruction.  13. Excellent long-term surgical result s/p Tetralogy of Fallot repair and revision with RV-PA homograft.  14. Normal peak flow velocity across pulmonary homograft, peak 1.3 m/sec, normal; trace insufficiciency present, stable, normal.  15. Normal main and branch pulmonary arteries.  16. Normal aortic valve.  17. Right aortic arch with mirror-image branching.  18. Aortic root is dilated, frequent finding in Tetralogy of Fallot.  19. No pericardial effusion  )      Anesthesia Quick Evaluation

## 2021-10-14 ENCOUNTER — Ambulatory Visit (HOSPITAL_COMMUNITY): Payer: 59

## 2021-10-14 ENCOUNTER — Encounter (HOSPITAL_COMMUNITY): Payer: Self-pay | Admitting: Neurological Surgery

## 2021-10-14 ENCOUNTER — Ambulatory Visit (HOSPITAL_BASED_OUTPATIENT_CLINIC_OR_DEPARTMENT_OTHER): Payer: Worker's Compensation | Admitting: Certified Registered Nurse Anesthetist

## 2021-10-14 ENCOUNTER — Other Ambulatory Visit: Payer: Self-pay

## 2021-10-14 ENCOUNTER — Observation Stay (HOSPITAL_COMMUNITY)
Admission: RE | Admit: 2021-10-14 | Discharge: 2021-10-15 | Disposition: A | Discharge status: Home / Self Care | Payer: Worker's Compensation | Source: Ambulatory Visit | Attending: Neurological Surgery | Admitting: Neurological Surgery

## 2021-10-14 ENCOUNTER — Ambulatory Visit (HOSPITAL_COMMUNITY)
Admission: RE | Disposition: A | Discharge status: Home / Self Care | Payer: Self-pay | Source: Ambulatory Visit | Attending: Neurological Surgery

## 2021-10-14 ENCOUNTER — Ambulatory Visit (HOSPITAL_COMMUNITY): Payer: Worker's Compensation | Admitting: Physician Assistant

## 2021-10-14 DIAGNOSIS — E039 Hypothyroidism, unspecified: Secondary | ICD-10-CM | POA: Diagnosis not present

## 2021-10-14 DIAGNOSIS — I10 Essential (primary) hypertension: Secondary | ICD-10-CM

## 2021-10-14 DIAGNOSIS — F418 Other specified anxiety disorders: Secondary | ICD-10-CM | POA: Diagnosis not present

## 2021-10-14 DIAGNOSIS — J45909 Unspecified asthma, uncomplicated: Secondary | ICD-10-CM | POA: Insufficient documentation

## 2021-10-14 DIAGNOSIS — M5114 Intervertebral disc disorders with radiculopathy, thoracic region: Principal | ICD-10-CM | POA: Insufficient documentation

## 2021-10-14 DIAGNOSIS — Z79899 Other long term (current) drug therapy: Secondary | ICD-10-CM | POA: Diagnosis not present

## 2021-10-14 DIAGNOSIS — M5124 Other intervertebral disc displacement, thoracic region: Secondary | ICD-10-CM | POA: Diagnosis present

## 2021-10-14 HISTORY — PX: THORACIC DISCECTOMY: SHX6113

## 2021-10-14 LAB — HEMOGLOBIN A1C
Hgb A1c MFr Bld: 6.1 % — ABNORMAL HIGH (ref 4.8–5.6)
Mean Plasma Glucose: 128.37 mg/dL

## 2021-10-14 LAB — GLUCOSE, CAPILLARY: Glucose-Capillary: 116 mg/dL — ABNORMAL HIGH (ref 70–99)

## 2021-10-14 LAB — SURGICAL PCR SCREEN
MRSA, PCR: NEGATIVE
Staphylococcus aureus: POSITIVE — AB

## 2021-10-14 SURGERY — THORACIC DISCECTOMY
Anesthesia: General | Site: Spine Thoracic | Laterality: Left

## 2021-10-14 MED ORDER — PROMETHAZINE HCL 25 MG/ML IJ SOLN: 6.2500 mg | INTRAMUSCULAR | Status: DC | PRN

## 2021-10-14 MED ORDER — POLYETHYLENE GLYCOL 3350 17 G PO PACK: 17.0000 g | PACK | Freq: Every day | ORAL | Status: DC | PRN

## 2021-10-14 MED ORDER — ACETAMINOPHEN 10 MG/ML IV SOLN
INTRAVENOUS | Status: AC
  Filled 2021-10-14: qty 100

## 2021-10-14 MED ORDER — FENTANYL CITRATE (PF) 250 MCG/5ML IJ SOLN
INTRAMUSCULAR | Status: AC
  Filled 2021-10-14: qty 5

## 2021-10-14 MED ORDER — ACETAMINOPHEN 650 MG RE SUPP: 650.0000 mg | RECTAL | Status: DC | PRN

## 2021-10-14 MED ORDER — DULOXETINE HCL 30 MG PO CPEP
30.0000 mg | ORAL_CAPSULE | Freq: Every day | ORAL | Status: DC
  Administered 2021-10-15: 30 mg via ORAL
  Filled 2021-10-14: qty 1

## 2021-10-14 MED ORDER — LIDOCAINE 2% (20 MG/ML) 5 ML SYRINGE
INTRAMUSCULAR | Status: AC
  Filled 2021-10-14: qty 5

## 2021-10-14 MED ORDER — PHENOL 1.4 % MT LIQD
1.0000 | OROMUCOSAL | Status: DC | PRN
Start: 2021-10-14 — End: 2021-10-15

## 2021-10-14 MED ORDER — ACETAMINOPHEN 10 MG/ML IV SOLN: 1000.0000 mg | Freq: Once | INTRAVENOUS | Status: DC | PRN

## 2021-10-14 MED ORDER — HEMOSTATIC AGENTS (NO CHARGE) OPTIME
TOPICAL | Status: DC | PRN
  Administered 2021-10-14: 1 via TOPICAL

## 2021-10-14 MED ORDER — TAMSULOSIN HCL 0.4 MG PO CAPS
0.4000 mg | ORAL_CAPSULE | Freq: Every day | ORAL | Status: DC
  Administered 2021-10-15: 0.4 mg via ORAL

## 2021-10-14 MED ORDER — CEFAZOLIN SODIUM-DEXTROSE 2-4 GM/100ML-% IV SOLN
2.0000 g | INTRAVENOUS | Status: AC
  Administered 2021-10-14: 2 g via INTRAVENOUS
  Filled 2021-10-14: qty 100

## 2021-10-14 MED ORDER — SENNA 8.6 MG PO TABS: 1.0000 | ORAL_TABLET | Freq: Two times a day (BID) | ORAL | Status: DC

## 2021-10-14 MED ORDER — HYDROXYZINE HCL 25 MG PO TABS: 50.0000 mg | ORAL_TABLET | Freq: Four times a day (QID) | ORAL | Status: DC | PRN

## 2021-10-14 MED ORDER — SENNOSIDES-DOCUSATE SODIUM 8.6-50 MG PO TABS
1.0000 | ORAL_TABLET | Freq: Two times a day (BID) | ORAL | Status: DC
  Administered 2021-10-14 – 2021-10-15 (×2): 1 via ORAL
  Filled 2021-10-14 (×2): qty 1

## 2021-10-14 MED ORDER — BISACODYL 5 MG PO TBEC
5.0000 mg | DELAYED_RELEASE_TABLET | Freq: Every day | ORAL | Status: DC | PRN
Start: 2021-10-14 — End: 2021-10-15

## 2021-10-14 MED ORDER — SUGAMMADEX SODIUM 200 MG/2ML IV SOLN
INTRAVENOUS | Status: DC | PRN
  Administered 2021-10-14: 200 mg via INTRAVENOUS

## 2021-10-14 MED ORDER — FLEET ENEMA 7-19 GM/118ML RE ENEM: 1.0000 | ENEMA | Freq: Once | RECTAL | Status: DC | PRN

## 2021-10-14 MED ORDER — THROMBIN 5000 UNITS EX SOLR: OROMUCOSAL | Status: DC | PRN

## 2021-10-14 MED ORDER — PREGABALIN 75 MG PO CAPS
75.0000 mg | ORAL_CAPSULE | Freq: Three times a day (TID) | ORAL | Status: DC
  Administered 2021-10-14 – 2021-10-15 (×3): 75 mg via ORAL
  Filled 2021-10-14 (×3): qty 1

## 2021-10-14 MED ORDER — ROCURONIUM BROMIDE 10 MG/ML (PF) SYRINGE
PREFILLED_SYRINGE | INTRAVENOUS | Status: DC | PRN
  Administered 2021-10-14: 70 mg via INTRAVENOUS
  Administered 2021-10-14: 30 mg via INTRAVENOUS

## 2021-10-14 MED ORDER — ROCURONIUM BROMIDE 10 MG/ML (PF) SYRINGE
PREFILLED_SYRINGE | INTRAVENOUS | Status: AC
  Filled 2021-10-14: qty 10

## 2021-10-14 MED ORDER — ACETAMINOPHEN 10 MG/ML IV SOLN
INTRAVENOUS | Status: DC | PRN
  Administered 2021-10-14: 1000 mg via INTRAVENOUS

## 2021-10-14 MED ORDER — LISINOPRIL 20 MG PO TABS
20.0000 mg | ORAL_TABLET | Freq: Two times a day (BID) | ORAL | Status: DC
  Administered 2021-10-14 – 2021-10-15 (×3): 20 mg via ORAL
  Filled 2021-10-14 (×3): qty 1

## 2021-10-14 MED ORDER — FENTANYL CITRATE (PF) 250 MCG/5ML IJ SOLN
INTRAMUSCULAR | Status: DC | PRN
Start: 2021-10-14 — End: 2021-10-14
  Administered 2021-10-14 (×3): 50 ug via INTRAVENOUS
  Administered 2021-10-14: 100 ug via INTRAVENOUS

## 2021-10-14 MED ORDER — THROMBIN 5000 UNITS EX SOLR
CUTANEOUS | Status: AC
  Filled 2021-10-14: qty 15000

## 2021-10-14 MED ORDER — BUPIVACAINE HCL (PF) 0.5 % IJ SOLN
INTRAMUSCULAR | Status: DC | PRN
  Administered 2021-10-14: 25 mL

## 2021-10-14 MED ORDER — PROPOFOL 10 MG/ML IV BOLUS
INTRAVENOUS | Status: AC
  Filled 2021-10-14: qty 20

## 2021-10-14 MED ORDER — BUPROPION HCL ER (SR) 150 MG PO TB12
150.0000 mg | ORAL_TABLET | Freq: Two times a day (BID) | ORAL | Status: DC
  Administered 2021-10-14 – 2021-10-15 (×2): 150 mg via ORAL
  Filled 2021-10-14 (×3): qty 1

## 2021-10-14 MED ORDER — MIDAZOLAM HCL 5 MG/5ML IJ SOLN
INTRAMUSCULAR | Status: DC | PRN
  Administered 2021-10-14: 2 mg via INTRAVENOUS

## 2021-10-14 MED ORDER — ONDANSETRON HCL 4 MG PO TABS: 4.0000 mg | ORAL_TABLET | Freq: Four times a day (QID) | ORAL | Status: DC | PRN

## 2021-10-14 MED ORDER — 0.9 % SODIUM CHLORIDE (POUR BTL) OPTIME
TOPICAL | Status: DC | PRN
  Administered 2021-10-14: 1000 mL

## 2021-10-14 MED ORDER — VANCOMYCIN HCL 1250 MG/250ML IV SOLN
1250.0000 mg | Freq: Once | INTRAVENOUS | Status: AC
  Administered 2021-10-14: 1250 mg via INTRAVENOUS
  Filled 2021-10-14: qty 250

## 2021-10-14 MED ORDER — LIDOCAINE 2% (20 MG/ML) 5 ML SYRINGE
INTRAMUSCULAR | Status: DC | PRN
  Administered 2021-10-14: 50 mg via INTRAVENOUS

## 2021-10-14 MED ORDER — ONDANSETRON HCL 4 MG/2ML IJ SOLN
4.0000 mg | Freq: Four times a day (QID) | INTRAMUSCULAR | Status: DC | PRN
  Administered 2021-10-14: 4 mg via INTRAVENOUS
  Filled 2021-10-14: qty 2

## 2021-10-14 MED ORDER — METHOCARBAMOL 500 MG PO TABS
500.0000 mg | ORAL_TABLET | Freq: Four times a day (QID) | ORAL | Status: DC | PRN
  Administered 2021-10-14 – 2021-10-15 (×2): 500 mg via ORAL
  Filled 2021-10-14 (×2): qty 1

## 2021-10-14 MED ORDER — BUPIVACAINE HCL (PF) 0.5 % IJ SOLN
INTRAMUSCULAR | Status: AC
Start: 2021-10-14 — End: ?
  Filled 2021-10-14: qty 30

## 2021-10-14 MED ORDER — HYDROMORPHONE HCL 1 MG/ML IJ SOLN
INTRAMUSCULAR | Status: DC | PRN
  Administered 2021-10-14: .5 mg via INTRAVENOUS

## 2021-10-14 MED ORDER — SODIUM CHLORIDE 0.9% FLUSH: 3.0000 mL | INTRAVENOUS | Status: DC | PRN

## 2021-10-14 MED ORDER — CHLORHEXIDINE GLUCONATE 0.12 % MT SOLN
OROMUCOSAL | Status: AC
  Administered 2021-10-14: 15 mL
  Filled 2021-10-14: qty 15

## 2021-10-14 MED ORDER — SODIUM CHLORIDE 0.9 % IV SOLN: 250.0000 mL | INTRAVENOUS | Status: DC

## 2021-10-14 MED ORDER — HYDROMORPHONE HCL 1 MG/ML IJ SOLN
INTRAMUSCULAR | Status: AC
  Filled 2021-10-14: qty 0.5

## 2021-10-14 MED ORDER — CYCLOBENZAPRINE HCL 5 MG PO TABS
5.0000 mg | ORAL_TABLET | Freq: Three times a day (TID) | ORAL | Status: DC | PRN
  Administered 2021-10-14 (×2): 5 mg via ORAL
  Filled 2021-10-14 (×2): qty 1

## 2021-10-14 MED ORDER — ACETAMINOPHEN 500 MG PO TABS: 1000.0000 mg | ORAL_TABLET | Freq: Four times a day (QID) | ORAL | Status: DC | PRN

## 2021-10-14 MED ORDER — THROMBIN 20000 UNITS EX KIT
PACK | CUTANEOUS | Status: DC | PRN
  Administered 2021-10-14 (×2): 5000 [IU] via TOPICAL

## 2021-10-14 MED ORDER — KETOROLAC TROMETHAMINE 30 MG/ML IJ SOLN
INTRAMUSCULAR | Status: DC | PRN
  Administered 2021-10-14: 15 mg via INTRAVENOUS

## 2021-10-14 MED ORDER — SODIUM CHLORIDE 0.9% FLUSH
3.0000 mL | Freq: Two times a day (BID) | INTRAVENOUS | Status: DC
  Administered 2021-10-14 – 2021-10-15 (×3): 3 mL via INTRAVENOUS

## 2021-10-14 MED ORDER — PROPOFOL 10 MG/ML IV BOLUS
INTRAVENOUS | Status: DC | PRN
  Administered 2021-10-14: 160 mg via INTRAVENOUS

## 2021-10-14 MED ORDER — KETOROLAC TROMETHAMINE 15 MG/ML IJ SOLN
15.0000 mg | Freq: Four times a day (QID) | INTRAMUSCULAR | Status: AC
  Administered 2021-10-14 – 2021-10-15 (×4): 15 mg via INTRAVENOUS
  Filled 2021-10-14 (×4): qty 1

## 2021-10-14 MED ORDER — CHLORHEXIDINE GLUCONATE CLOTH 2 % EX PADS: 6.0000 | MEDICATED_PAD | Freq: Once | CUTANEOUS | Status: DC

## 2021-10-14 MED ORDER — MENTHOL 3 MG MT LOZG: 1.0000 | LOZENGE | OROMUCOSAL | Status: DC | PRN

## 2021-10-14 MED ORDER — LIDOCAINE-EPINEPHRINE 1 %-1:100000 IJ SOLN: INTRAMUSCULAR | Status: DC | PRN

## 2021-10-14 MED ORDER — ALBUMIN HUMAN 5 % IV SOLN: INTRAVENOUS | Status: DC | PRN

## 2021-10-14 MED ORDER — MIDAZOLAM HCL 2 MG/2ML IJ SOLN
INTRAMUSCULAR | Status: AC
  Filled 2021-10-14: qty 2

## 2021-10-14 MED ORDER — HYDROMORPHONE HCL 2 MG PO TABS
ORAL_TABLET | ORAL | Status: AC
  Filled 2021-10-14: qty 2

## 2021-10-14 MED ORDER — ACETAMINOPHEN 325 MG PO TABS: 650.0000 mg | ORAL_TABLET | ORAL | Status: DC | PRN

## 2021-10-14 MED ORDER — METHADONE HCL 10 MG PO TABS
20.0000 mg | ORAL_TABLET | Freq: Two times a day (BID) | ORAL | Status: DC
  Administered 2021-10-15: 20 mg via ORAL
  Filled 2021-10-14: qty 2

## 2021-10-14 MED ORDER — ALUM & MAG HYDROXIDE-SIMETH 200-200-20 MG/5ML PO SUSP: 30.0000 mL | Freq: Four times a day (QID) | ORAL | Status: DC | PRN

## 2021-10-14 MED ORDER — PHENYLEPHRINE 80 MCG/ML (10ML) SYRINGE FOR IV PUSH (FOR BLOOD PRESSURE SUPPORT)
PREFILLED_SYRINGE | INTRAVENOUS | Status: DC | PRN
  Administered 2021-10-14 (×3): 80 ug via INTRAVENOUS
  Administered 2021-10-14: 160 ug via INTRAVENOUS
  Administered 2021-10-14 (×3): 80 ug via INTRAVENOUS
  Administered 2021-10-14: 160 ug via INTRAVENOUS

## 2021-10-14 MED ORDER — EPHEDRINE SULFATE-NACL 50-0.9 MG/10ML-% IV SOSY
PREFILLED_SYRINGE | INTRAVENOUS | Status: DC | PRN
  Administered 2021-10-14 (×5): 5 mg via INTRAVENOUS

## 2021-10-14 MED ORDER — DULOXETINE HCL 30 MG PO CPEP
60.0000 mg | ORAL_CAPSULE | Freq: Every day | ORAL | Status: DC
  Administered 2021-10-15: 60 mg via ORAL
  Filled 2021-10-14: qty 2

## 2021-10-14 MED ORDER — ACETAMINOPHEN 325 MG PO TABS: 325.0000 mg | ORAL_TABLET | Freq: Once | ORAL | Status: DC | PRN

## 2021-10-14 MED ORDER — HYDROMORPHONE HCL 2 MG PO TABS
4.0000 mg | ORAL_TABLET | ORAL | Status: DC | PRN
  Administered 2021-10-14 – 2021-10-15 (×5): 4 mg via ORAL
  Filled 2021-10-14 (×4): qty 2

## 2021-10-14 MED ORDER — KETAMINE HCL 10 MG/ML IJ SOLN
INTRAMUSCULAR | Status: DC | PRN
  Administered 2021-10-14 (×3): 10 mg via INTRAVENOUS

## 2021-10-14 MED ORDER — LACTATED RINGERS IV SOLN: INTRAVENOUS | Status: DC

## 2021-10-14 MED ORDER — LACTATED RINGERS IV SOLN: INTRAVENOUS | Status: DC | PRN

## 2021-10-14 MED ORDER — PANTOPRAZOLE SODIUM 40 MG PO TBEC
40.0000 mg | DELAYED_RELEASE_TABLET | Freq: Two times a day (BID) | ORAL | Status: DC
  Administered 2021-10-14 – 2021-10-15 (×2): 40 mg via ORAL
  Filled 2021-10-14 (×2): qty 1

## 2021-10-14 MED ORDER — BUPROPION HCL ER (SMOKING DET) 150 MG PO TB12
150.0000 mg | ORAL_TABLET | Freq: Two times a day (BID) | ORAL | Status: DC
  Filled 2021-10-14 (×2): qty 1

## 2021-10-14 MED ORDER — PHENYLEPHRINE 80 MCG/ML (10ML) SYRINGE FOR IV PUSH (FOR BLOOD PRESSURE SUPPORT)
PREFILLED_SYRINGE | INTRAVENOUS | Status: AC
  Filled 2021-10-14: qty 10

## 2021-10-14 MED ORDER — BISACODYL 10 MG RE SUPP: 10.0000 mg | Freq: Every day | RECTAL | Status: DC | PRN

## 2021-10-14 MED ORDER — MAGNESIUM OXIDE -MG SUPPLEMENT 400 (240 MG) MG PO TABS
400.0000 mg | ORAL_TABLET | Freq: Two times a day (BID) | ORAL | Status: DC
  Administered 2021-10-14 – 2021-10-15 (×2): 400 mg via ORAL
  Filled 2021-10-14 (×2): qty 1

## 2021-10-14 MED ORDER — DOCUSATE SODIUM 100 MG PO CAPS
100.0000 mg | ORAL_CAPSULE | Freq: Two times a day (BID) | ORAL | Status: DC
  Administered 2021-10-14 – 2021-10-15 (×2): 100 mg via ORAL
  Filled 2021-10-14 (×2): qty 1

## 2021-10-14 MED ORDER — CHLORHEXIDINE GLUCONATE CLOTH 2 % EX PADS
6.0000 | MEDICATED_PAD | Freq: Every day | CUTANEOUS | Status: DC
  Administered 2021-10-15: 6 via TOPICAL

## 2021-10-14 MED ORDER — PHENYLEPHRINE HCL-NACL 20-0.9 MG/250ML-% IV SOLN
INTRAVENOUS | Status: DC | PRN
  Administered 2021-10-14: 40 ug/min via INTRAVENOUS

## 2021-10-14 MED ORDER — AMISULPRIDE (ANTIEMETIC) 5 MG/2ML IV SOLN: 10.0000 mg | Freq: Once | INTRAVENOUS | Status: DC | PRN

## 2021-10-14 MED ORDER — DEXAMETHASONE SODIUM PHOSPHATE 10 MG/ML IJ SOLN
INTRAMUSCULAR | Status: DC | PRN
  Administered 2021-10-14: 10 mg via INTRAVENOUS

## 2021-10-14 MED ORDER — LIDOCAINE-EPINEPHRINE 1 %-1:100000 IJ SOLN
INTRAMUSCULAR | Status: AC
  Filled 2021-10-14: qty 1

## 2021-10-14 MED ORDER — ACETAMINOPHEN 160 MG/5ML PO SOLN: 325.0000 mg | Freq: Once | ORAL | Status: DC | PRN

## 2021-10-14 MED ORDER — MUPIROCIN 2 % EX OINT
1.0000 | TOPICAL_OINTMENT | Freq: Two times a day (BID) | CUTANEOUS | Status: DC
  Administered 2021-10-14 – 2021-10-15 (×3): 1 via NASAL
  Filled 2021-10-14: qty 22

## 2021-10-14 MED ORDER — HYDROMORPHONE HCL 1 MG/ML IJ SOLN
1.0000 mg | INTRAMUSCULAR | Status: DC | PRN
  Administered 2021-10-14 (×2): 1 mg via INTRAVENOUS
  Filled 2021-10-14 (×2): qty 1

## 2021-10-14 MED ORDER — METHOCARBAMOL 1000 MG/10ML IJ SOLN
500.0000 mg | Freq: Four times a day (QID) | INTRAVENOUS | Status: DC | PRN
  Filled 2021-10-14: qty 5

## 2021-10-14 MED ORDER — KETAMINE HCL 50 MG/5ML IJ SOSY
PREFILLED_SYRINGE | INTRAMUSCULAR | Status: AC
  Filled 2021-10-14: qty 5

## 2021-10-14 MED ORDER — HYDROMORPHONE HCL 1 MG/ML IJ SOLN: 0.2500 mg | INTRAMUSCULAR | Status: DC | PRN

## 2021-10-14 MED ORDER — MEPERIDINE HCL 25 MG/ML IJ SOLN: 6.2500 mg | INTRAMUSCULAR | Status: DC | PRN

## 2021-10-14 MED ORDER — KETOROLAC TROMETHAMINE 30 MG/ML IJ SOLN
INTRAMUSCULAR | Status: AC
  Filled 2021-10-14: qty 1

## 2021-10-14 MED ORDER — EPHEDRINE 5 MG/ML INJ
INTRAVENOUS | Status: AC
  Filled 2021-10-14: qty 5

## 2021-10-14 SURGICAL SUPPLY — 58 items
ADH SKN CLS APL DERMABOND .7 (GAUZE/BANDAGES/DRESSINGS) ×1
BAG COUNTER SPONGE SURGICOUNT (BAG) ×3 IMPLANT
BAG SPNG CNTER NS LX DISP (BAG) ×1
BAND INSRT 18 STRL LF DISP RB (MISCELLANEOUS) ×2
BAND RUBBER #18 3X1/16 STRL (MISCELLANEOUS) ×2 IMPLANT
BLADE CLIPPER SURG (BLADE) IMPLANT
BUR ACORN 6.0 (BURR) IMPLANT
BUR MATCHSTICK NEURO 3.0 LAGG (BURR) ×3 IMPLANT
CANISTER SUCT 3000ML PPV (MISCELLANEOUS) ×3 IMPLANT
DERMABOND ADVANCED (GAUZE/BANDAGES/DRESSINGS) ×1
DERMABOND ADVANCED .7 DNX12 (GAUZE/BANDAGES/DRESSINGS) ×2 IMPLANT
DEVICE DISSECT PLASMABLAD 3.0S (MISCELLANEOUS) ×2 IMPLANT
DRAPE HALF SHEET 40X57 (DRAPES) IMPLANT
DRAPE INCISE 23X17 IOBAN STRL (DRAPES) ×1
DRAPE INCISE 23X17 STRL (DRAPES) IMPLANT
DRAPE INCISE IOBAN 23X17 STRL (DRAPES) ×1 IMPLANT
DRAPE LAPAROTOMY T 102X78X121 (DRAPES) ×3 IMPLANT
DRAPE MICROSCOPE LEICA (MISCELLANEOUS) IMPLANT
DRSG OPSITE POSTOP 4X6 (GAUZE/BANDAGES/DRESSINGS) ×1 IMPLANT
DURAPREP 26ML APPLICATOR (WOUND CARE) ×3 IMPLANT
ELECT REM PT RETURN 9FT ADLT (ELECTROSURGICAL) ×2
ELECTRODE REM PT RTRN 9FT ADLT (ELECTROSURGICAL) ×2 IMPLANT
GAUZE 4X4 16PLY ~~LOC~~+RFID DBL (SPONGE) IMPLANT
GAUZE SPONGE 4X4 12PLY STRL (GAUZE/BANDAGES/DRESSINGS) ×3 IMPLANT
GLOVE BIOGEL PI IND STRL 6.5 (GLOVE) IMPLANT
GLOVE BIOGEL PI IND STRL 7.0 (GLOVE) IMPLANT
GLOVE BIOGEL PI IND STRL 8.5 (GLOVE) ×2 IMPLANT
GLOVE BIOGEL PI INDICATOR 6.5 (GLOVE) ×3
GLOVE BIOGEL PI INDICATOR 7.0 (GLOVE) ×1
GLOVE BIOGEL PI INDICATOR 8.5 (GLOVE) ×1
GLOVE ECLIPSE 8.5 STRL (GLOVE) ×3 IMPLANT
GOWN STRL REUS W/ TWL LRG LVL3 (GOWN DISPOSABLE) IMPLANT
GOWN STRL REUS W/ TWL XL LVL3 (GOWN DISPOSABLE) IMPLANT
GOWN STRL REUS W/TWL 2XL LVL3 (GOWN DISPOSABLE) ×3 IMPLANT
GOWN STRL REUS W/TWL LRG LVL3 (GOWN DISPOSABLE) ×2
GOWN STRL REUS W/TWL XL LVL3 (GOWN DISPOSABLE) ×4
HEMOSTAT POWDER KIT SURGIFOAM (HEMOSTASIS) ×3 IMPLANT
KIT BASIN OR (CUSTOM PROCEDURE TRAY) ×3 IMPLANT
KIT TURNOVER KIT B (KITS) ×3 IMPLANT
NDL SPNL 20GX3.5 QUINCKE YW (NEEDLE) IMPLANT
NEEDLE HYPO 22GX1.5 SAFETY (NEEDLE) ×3 IMPLANT
NEEDLE SPNL 20GX3.5 QUINCKE YW (NEEDLE) IMPLANT
NS IRRIG 1000ML POUR BTL (IV SOLUTION) ×3 IMPLANT
PACK LAMINECTOMY NEURO (CUSTOM PROCEDURE TRAY) ×3 IMPLANT
PAD ARMBOARD 7.5X6 YLW CONV (MISCELLANEOUS) ×12 IMPLANT
PATTIES SURGICAL .5 X1 (DISPOSABLE) ×3 IMPLANT
PENCIL BUTTON HOLSTER BLD 10FT (ELECTRODE) IMPLANT
PLASMABLADE 3.0S (MISCELLANEOUS) ×2
SPIKE FLUID TRANSFER (MISCELLANEOUS) ×3 IMPLANT
SPONGE SURGIFOAM ABS GEL SZ50 (HEMOSTASIS) ×3 IMPLANT
SUT VIC AB 1 CT1 18XBRD ANBCTR (SUTURE) ×2 IMPLANT
SUT VIC AB 1 CT1 8-18 (SUTURE) ×2
SUT VIC AB 2-0 CP2 18 (SUTURE) ×3 IMPLANT
SUT VIC AB 3-0 SH 8-18 (SUTURE) ×3 IMPLANT
SUT VIC AB 4-0 RB1 18 (SUTURE) ×3 IMPLANT
TOWEL GREEN STERILE (TOWEL DISPOSABLE) ×3 IMPLANT
TOWEL GREEN STERILE FF (TOWEL DISPOSABLE) ×3 IMPLANT
WATER STERILE IRR 1000ML POUR (IV SOLUTION) ×3 IMPLANT

## 2021-10-14 NOTE — Op Note (Signed)
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  Thoracic radiculopathy. Postoperative diagnosis: Same Procedure: T9-T10 hemilaminectomy facetectomy and discectomy with operating microscope microdissection technique Surgeon: Barnett Abu First Assistant: Julio Sicks, MD Anesthesia: General endotracheal Indications: Albert Watson is a 31 year old individual whose had significant back pain and chest wall pain he has evidence of a large herniated nucleus pulposus at the T9-T10 level he also has degenerative changes in the disc elsewhere in the thoracic spine.  Is been advised regarding surgical discectomy to decompress the T9-T10 level. Procedure: The patient was brought to the operating room supine on the stretcher.  After the smooth induction of general tracheal anesthesia his back was cleansed with alcohol and ChloraPrep and draped in a sterile fashion.  Fluoroscopic guidance was then used to localize the T9-T10 disc space by counting the ribs properly from below.  Once the localization was performed the skin was infiltrated with 10 cc of lidocaine 1% with epinephrine mixed 50-50 with half percent Marcaine.  Dissection was carried down to the thoracodorsal fascia which was opened on the left side of midline.  Subperiosteal dissection was performed to expose the lateral aspect of the T9-T10 space over the T9 lamina.  Then the T9 lamina was removed partially from the inferior aspect out to and including the entirety of the facet joint between T9 and T10 on the left side.  The soft tissues were carefully dissected and there was noted to be an abundance of epidural fat in this region.  This was carefully cauterized and resected in the lateral aspect of the dura then by carefully exploring the region of the disc space and the ventral floor I came upon a mass that appeared true did dorsally into the lateral aspect of  the dura.  This corresponded with the disc herniation seen in the left lateral aspect on the MRI.  Using the operating microscope and microdissection technique we then exposed further dissecting down to the pedicle at T10.  The mass was behind this area and by carefully dissecting the dura we are able to separated some from the underlying mass there was however significant attachment to the dura more medially.  The undersurface of the mass was incised and several small fragments of disc were removed from this region then by using microdissectors further disc material could be removed along with portions of the ligament as it was freed from the dura ultimately a good decompression was obtained towards the midline removing several sizable fragments of disc in this area.  When the decompression was completed it was evident by probing that there was no further large fragments of disc material left and at this point hemostasis was achieved in all the soft tissues in the epidural space.  Dr. Dutch Quint helped with dissection suctioning while worked two Handly under the dura to remove fragments of the disc.  Small pledgets of Gelfoam were used in addition to some Surgifoam which was later irrigated away.  Once good hemostasis was achieved the microscope was withdrawn and then the thoracodorsal fascia was closed with #1 Vicryl in interrupted fashion 20 cc of half percent Marcaine was injected into the paraspinous fascia for added analgesia.  2-0 Vicryl was used in the subcutaneous tissues 3-0 Vicryl subcuticularly Dermabond was placed on the skin blood loss was estimated approximately 100 cc for this procedure.  Patient tolerated procedure well was returned to recovery room in stable condition.

## 2021-10-14 NOTE — Anesthesia Procedure Notes (Signed)
Procedure Name: Intubation Date/Time: 10/14/2021 8:29 AM  Performed by: Colin Benton, CRNAPre-anesthesia Checklist: Patient identified, Emergency Drugs available, Suction available and Patient being monitored Patient Re-evaluated:Patient Re-evaluated prior to induction Oxygen Delivery Method: Circle system utilized Preoxygenation: Pre-oxygenation with 100% oxygen Induction Type: IV induction Ventilation: Mask ventilation without difficulty Laryngoscope Size: Mac and 3 Grade View: Grade I Tube type: Oral Tube size: 7.5 mm Number of attempts: 1 Airway Equipment and Method: Stylet Placement Confirmation: ETT inserted through vocal cords under direct vision, positive ETCO2 and breath sounds checked- equal and bilateral Secured at: 22 cm Tube secured with: Tape Dental Injury: Teeth and Oropharynx as per pre-operative assessment

## 2021-10-14 NOTE — H&P (Signed)
Albert Watson is an 31 y.o. male.   Chief Complaint: Thoracic back pain HNP T9-T10 HPI: Albert Watson is a 31 year old individual who had a work-related injury nearly 2 years ago.  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  He was seen at [facility] where he was advised regarding sick nephric and surgery to fuse about 7 levels across the thoracolumbar junction.  I had seen the patient more recently and noted that the biggest issue that he has a T9-10 disc herniation on the left side that is effacing the left side of the cord and causing significant compromise at that time he is having primarily left thoracic wall pain corresponded well.  He notes that since that time he has had some radiation of pain around to the opposite side.  On July 1 he underwent a repeat MRI that demonstrates essentially the same findings with some adjacent disc disease at T18 9 and T7-T8 there is more eccentric to the right side.  Nonetheless there is no overt basement or compression of the cord at that side.  He is now being admitted for the microdiscectomy on the left side at the T9-T10 level.  Past Medical History:  Diagnosis Date   Anxiety    Asthma, chronic    Depression    Dyspepsia    Environmental allergies    Familial combined hyperlipidemia    GERD (gastroesophageal reflux disease)    Goiter    Hard of hearing    Horner's syndrome    Hypertension    Hypothyroidism, acquired, autoimmune    Obesity (BMI 30-39.9)    Status post surgery for complex congenital heart disease    Tetralogy of Fallot    Thyroiditis, autoimmune     Past Surgical History:  Procedure Laterality Date   CARDIAC SURGERY     complex congenital heart disease     3 separate surgeries (pulmonary valve replaced)   EXTERNAL EAR SURGERY     KNEE SURGERY     LUMBAR SPINE SURGERY      Family History  Problem Relation Age of Onset    Hyperlipidemia Mother        Increased cholesterol and triglycerides   Diabetes Mother        T2 DM   Obesity Mother    Thyroid disease Father    Hyperlipidemia Father        High cholesterol   Heart disease Maternal Uncle        MI and CABG   Heart disease Maternal Grandmother        CABG   Hypertension Maternal Grandmother        T2 DM   Heart disease Maternal Grandfather        CABG and MIs   Cancer Maternal Grandfather        Skin   Heart disease Paternal Grandfather        Died of MI   Social History:  reports that he has never smoked. His smokeless tobacco use includes snuff. He reports that he does not drink alcohol and does not use drugs.  Allergies:  Allergies  Allergen Reactions   Codeine Hives   Hydrocodone-Guaifenesin Nausea And Vomiting   Septra [Bactrim] Hives   Sulfa Antibiotics Hives   Z-Pak [Azithromycin] Hives   Betadine [Povidone-Iodine] Rash   Penicillins Rash   Suprax [Cefixime] Rash    Medications Prior to  Admission  Medication Sig Dispense Refill   acetaminophen (TYLENOL) 500 MG tablet Take 1,000 mg by mouth every 6 (six) hours as needed for mild pain.     Ascorbic Acid (VITAMIN C PO) Take 1 tablet by mouth daily.     bisacodyl (DULCOLAX) 5 MG EC tablet Take 1 tablet (5 mg total) by mouth daily as needed for moderate constipation. 30 tablet 0   buPROPion (ZYBAN) 150 MG 12 hr tablet Take 150 mg by mouth 2 (two) times daily.     cyclobenzaprine (FLEXERIL) 5 MG tablet Take 1 tablet (5 mg total) by mouth 3 (three) times daily as needed for muscle spasms. 30 tablet 0   DULoxetine (CYMBALTA) 30 MG capsule Take 30 mg by mouth daily.     DULoxetine (CYMBALTA) 60 MG capsule Take 60 mg by mouth daily.     HYDROmorphone (DILAUDID) 4 MG tablet Take 1 tablet (4 mg total) by mouth every 4 (four) hours as needed for moderate pain (alternate with IV). 30 tablet 0   hydrOXYzine (ATARAX) 50 MG tablet Take 50 mg by mouth every 6 (six) hours as needed for anxiety.      lisinopril (ZESTRIL) 20 MG tablet Take 20 mg by mouth 2 (two) times daily.     magnesium oxide (MAG-OX) 400 (240 Mg) MG tablet Take 1 tablet (400 mg total) by mouth 2 (two) times daily. 60 tablet 0   meloxicam (MOBIC) 15 MG tablet Take 1 tablet (15 mg total) by mouth daily for 14 days. 14 tablet 0   methadone (DOLOPHINE) 10 MG tablet Take 20 mg by mouth 2 (two) times daily.     methylPREDNISolone (MEDROL DOSEPAK) 4 MG TBPK tablet follow package directions 21 tablet 0   pantoprazole (PROTONIX) 40 MG tablet Take 1 tablet (40 mg total) by mouth 2 (two) times daily for 14 days. 28 tablet 0   pregabalin (LYRICA) 75 MG capsule Take 75 mg by mouth in the morning, at noon, and at bedtime.     senna-docusate (SENOKOT-S) 8.6-50 MG tablet Take 1 tablet by mouth 2 (two) times daily for 14 days. 28 tablet 0   tamsulosin (FLOMAX) 0.4 MG CAPS capsule Take 0.4 mg by mouth daily.     lidocaine (LIDODERM) 5 % Place 1 patch onto the skin daily. Remove & Discard patch within 12 hours or as directed by MD 30 patch 0   OVER THE COUNTER MEDICATION Apply 1 Application topically daily as needed (back pain). CBD cream      Results for orders placed or performed during the hospital encounter of 10/14/21 (from the past 48 hour(s))  Glucose, capillary     Status: Abnormal   Collection Time: 10/14/21  6:11 AM  Result Value Ref Range   Glucose-Capillary 116 (H) 70 - 99 mg/dL    Comment: Glucose reference range applies only to samples taken after fasting for at least 8 hours.   No results found.  Review of Systems  Constitutional:  Positive for activity change.  Musculoskeletal:  Positive for back pain.  Psychiatric/Behavioral: Negative.    All other systems reviewed and are negative.   Blood pressure (!) 151/97, pulse (!) 106, temperature 98.6 F (37 C), temperature source Oral, resp. rate 18, height 5\' 7"  (1.702 m), weight 106.6 kg, SpO2 98 %. Physical Exam Constitutional:      Appearance: Normal appearance.  He is obese.  HENT:     Head: Normocephalic and atraumatic.     Right Ear: Tympanic membrane, ear  canal and external ear normal.     Left Ear: Tympanic membrane, ear canal and external ear normal.     Nose: Nose normal.     Mouth/Throat:     Mouth: Mucous membranes are moist.     Pharynx: Oropharynx is clear.  Eyes:     Extraocular Movements: Extraocular movements intact.     Conjunctiva/sclera: Conjunctivae normal.     Pupils: Pupils are equal, round, and reactive to light.  Cardiovascular:     Rate and Rhythm: Normal rate and regular rhythm.     Pulses: Normal pulses.     Heart sounds: Normal heart sounds.  Pulmonary:     Effort: Pulmonary effort is normal.     Breath sounds: Normal breath sounds.  Abdominal:     General: Abdomen is flat. Bowel sounds are normal.     Palpations: Abdomen is soft.  Musculoskeletal:        General: Normal range of motion.     Cervical back: Normal range of motion and neck supple.  Skin:    General: Skin is warm and dry.     Capillary Refill: Capillary refill takes less than 2 seconds.  Neurological:     General: No focal deficit present.     Mental Status: He is alert and oriented to person, place, and time.  Psychiatric:        Mood and Affect: Mood normal.        Behavior: Behavior normal.        Thought Content: Thought content normal.        Judgment: Judgment normal.      Assessment/Plan Herniated nucleus pulposus T9-T10 left.  Plan: Decompression T9-T10 left  Stefani Dama, MD 10/14/2021, 8:09 AM

## 2021-10-14 NOTE — Anesthesia Postprocedure Evaluation (Signed)
Anesthesia Post Note  Patient: Albert Watson  Procedure(s) Performed: Left Thoracic nine-Thoracic ten Discectomy (Left: Spine Thoracic)     Patient location during evaluation: PACU Anesthesia Type: General Level of consciousness: awake and alert Pain management: pain level controlled Vital Signs Assessment: post-procedure vital signs reviewed and stable Respiratory status: spontaneous breathing, nonlabored ventilation, respiratory function stable and patient connected to nasal cannula oxygen Cardiovascular status: blood pressure returned to baseline and stable Postop Assessment: no apparent nausea or vomiting Anesthetic complications: no   No notable events documented.  Last Vitals:  Vitals:   10/14/21 1254 10/14/21 1648  BP: (!) 141/79 129/71  Pulse: (!) 105 (!) 116  Resp: 18 18  Temp: 36.6 C 36.7 C  SpO2: 96% 98%    Last Pain:  Vitals:   10/14/21 1648  TempSrc: Oral  PainSc:                  Shelton Silvas

## 2021-10-14 NOTE — Progress Notes (Signed)
Patient ID: Albert Watson, male   DOB: [birth year], 31 y.o.   MRN: [id number] Vital signs are stable Patient has ambulated and is doing well He is feeling tingling in his left leg Incision is clean and dry Plan discharge for tomorrow

## 2021-10-14 NOTE — Transfer of Care (Signed)
Immediate Anesthesia Transfer of Care Note  Patient: Albert Watson  Procedure(s) Performed: Left Thoracic nine-Thoracic ten Discectomy (Left: Spine Thoracic)  Patient Location: PACU  Anesthesia Type:General  Level of Consciousness: drowsy  Airway & Oxygen Therapy: Patient Spontanous Breathing and Patient connected to nasal cannula oxygen  Post-op Assessment: Report given to RN and Post -op Vital signs reviewed and stable  Post vital signs: Reviewed and stable  Last Vitals:  Vitals Value Taken Time  BP 115/56 10/14/21 1054  Temp    Pulse 95 10/14/21 1058  Resp 14 10/14/21 1058  SpO2 91 % 10/14/21 1058  Vitals shown include unvalidated device data.  Last Pain:  Vitals:   10/14/21 0543  TempSrc: Oral         Complications: No notable events documented.

## 2021-10-15 ENCOUNTER — Encounter (HOSPITAL_COMMUNITY): Payer: Self-pay | Admitting: Neurological Surgery

## 2021-10-15 DIAGNOSIS — M5114 Intervertebral disc disorders with radiculopathy, thoracic region: Secondary | ICD-10-CM | POA: Diagnosis not present

## 2021-10-15 MED ORDER — HYDROMORPHONE HCL 4 MG PO TABS: 4.0000 mg | ORAL_TABLET | ORAL | 0 refills | Status: AC | PRN

## 2021-10-15 MED ORDER — CYCLOBENZAPRINE HCL 5 MG PO TABS: 5.0000 mg | ORAL_TABLET | Freq: Three times a day (TID) | ORAL | 3 refills | Status: AC | PRN

## 2021-10-15 MED FILL — Thrombin For Soln 5000 Unit: CUTANEOUS | Qty: 2 | Status: AC

## 2021-10-15 NOTE — TOC Transition Note (Signed)
Transition of Care [facility]) - CM/SW Discharge Note   Patient Details  Name: Albert Watson MRN: [id number] Date of Birth: [birth year]  [facility]) CM/SW Contact:  Pollie Friar, RN Phone Number: 10/15/2021, 10:28 AM   Clinical Narrative:    CM met with the patient and his spouse at the bedside yesterday afternoon.  Workers Comp rep: Roney Mans [phone] Currently per PT/OT no new DME needs and no therapy needs.  Pt has transportation home.   Final next level of care: Home/Self Care Barriers to Discharge: No Barriers Identified   Patient Goals and CMS Choice        Discharge Placement                       Discharge Plan and Services                                     Social Determinants of Health (SDOH) Interventions     Readmission Risk Interventions     No data to display

## 2021-10-15 NOTE — Discharge Summary (Signed)
Physician Discharge Summary  Patient ID: Albert Watson MRN: [id number] DOB/AGE: [birth year] 31 y.o.  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  He tolerated that well  Consults: None  Significant Diagnostic Studies: None  Treatments: surgery: See op note  Discharge Exam: Blood pressure 107/65, pulse 89, temperature 98.1 F (36.7 C), temperature source Oral, resp. rate 16, height 5\' 7"  (1.702 m), weight 106.6 kg, SpO2 100 %. Incision is clean and dry Station and gait are intact.  Mild dysesthesias on the left chest wall  Disposition: Discharge disposition: 01-Home or Self Care       Discharge Instructions     Call MD for:  redness, tenderness, or signs of infection (pain, swelling, redness, odor or green/yellow discharge around incision site)   Complete by: As directed    Call MD for:  severe uncontrolled pain   Complete by: As directed    Call MD for:  temperature >100.4   Complete by: As directed    Diet - low sodium heart healthy   Complete by: As directed    Discharge wound care:   Complete by: As directed    Okay to shower. Do not apply salves or appointments to incision. No heavy lifting with the upper extremities greater than 10 pounds. May resume driving when not requiring pain medication and patient feels comfortable with doing so.   Incentive spirometry RT   Complete by: As directed    Increase activity slowly   Complete by: As directed       Allergies as of 10/15/2021       Reactions   Codeine Hives   Hydrocodone-guaifenesin  Nausea And Vomiting   Septra [bactrim] Hives   Sulfa Antibiotics Hives   Z-pak [azithromycin] Hives   Betadine [povidone-iodine] Rash   Penicillins Rash   Suprax [cefixime] Rash        Medication List     TAKE these medications    acetaminophen 500 MG tablet Commonly known as: TYLENOL Take 1,000 mg by mouth every 6 (six) hours as needed for mild pain.   bisacodyl 5 MG EC tablet Commonly known as: DULCOLAX Take 1 tablet (5 mg total) by mouth daily as needed for moderate constipation.   buPROPion 150 MG 12 hr tablet Commonly known as: ZYBAN Take 150 mg by mouth 2 (two) times daily.   cyclobenzaprine 5 MG tablet Commonly known as: FLEXERIL Take 1 tablet (5 mg total) by mouth 3 (three) times daily as needed for muscle spasms.   DULoxetine 60 MG capsule Commonly known as: CYMBALTA Take 60 mg by mouth daily.   DULoxetine 30 MG capsule Commonly known as: CYMBALTA Take 30 mg by mouth daily.   HYDROmorphone 4 MG tablet Commonly known as: DILAUDID Take 1 tablet (4 mg total) by mouth every 4 (four) hours as needed for moderate pain (alternate with IV).   hydrOXYzine 50 MG tablet Commonly known as: ATARAX Take 50 mg by mouth every 6 (six) hours as needed for anxiety.   lidocaine 5 % Commonly known as: LIDODERM Place 1 patch onto the skin daily. Remove & Discard patch within 12 hours or as directed by MD  lisinopril 20 MG tablet Commonly known as: ZESTRIL Take 20 mg by mouth 2 (two) times daily.   magnesium oxide 400 (240 Mg) MG tablet Commonly known as: MAG-OX Take 1 tablet (400 mg total) by mouth 2 (two) times daily.   meloxicam 15 MG tablet Commonly known as: MOBIC Take 1 tablet (15 mg total) by mouth daily for 14 days.   methadone 10 MG tablet Commonly known as: DOLOPHINE Take 20 mg by mouth 2 (two) times daily.   methylPREDNISolone 4 MG Tbpk tablet Commonly known as: MEDROL DOSEPAK follow package directions   OVER THE COUNTER MEDICATION Apply 1  Application topically daily as needed (back pain). CBD cream   pantoprazole 40 MG tablet Commonly known as: PROTONIX Take 1 tablet (40 mg total) by mouth 2 (two) times daily for 14 days.   pregabalin 75 MG capsule Commonly known as: LYRICA Take 75 mg by mouth in the morning, at noon, and at bedtime.   senna-docusate 8.6-50 MG tablet Commonly known as: Senokot-S Take 1 tablet by mouth 2 (two) times daily for 14 days.   tamsulosin 0.4 MG Caps capsule Commonly known as: FLOMAX Take 0.4 mg by mouth daily.   VITAMIN C PO Take 1 tablet by mouth daily.               Discharge Care Instructions  (From admission, onward)           Start     Ordered   10/15/21 0000  Discharge wound care:       Comments: Okay to shower. Do not apply salves or appointments to incision. No heavy lifting with the upper extremities greater than 10 pounds. May resume driving when not requiring pain medication and patient feels comfortable with doing so.   10/15/21 1024             Signed: Shary Key Lylee Corrow 10/15/2021, 10:34 AM

## 2021-10-15 NOTE — Evaluation (Signed)
Occupational Therapy Evaluation Patient Details Name: Albert Watson MRN: [id number] DOB: March 14, 1991 Today's Date: 10/15/2021   History of Present Illness Pt is a 31 y/o male presenting on 7/17 for elective L sided T9-T10 hemilaminectomy facetectomy and discectomy (after work related injury nearly 2 years ago).  PMH includes: anxiety, depression, asthma, HTN, obesity, complex congenital heart disease with cardiac surgery, prior back surgery.   Clinical Impression   PTA patient independent with ADLS, using cane for limited mobility due to pain.  He was admitted for above and is limited by pain, precautions and activity tolerance.  Educated on back precautions, ADL compensatory techniques, AE/DME recommendations, safety, mobility progression.  He completes ADL and mobility with modified independence after education.  He will have good support of spouse initially.  No further OT needs identified and OT will sign off.      Recommendations for follow up therapy are one component of a multi-disciplinary discharge planning process, led by the attending physician.  Recommendations may be updated based on patient status, additional functional criteria and insurance authorization.   Follow Up Recommendations  No OT follow up    Assistance Recommended at Discharge PRN  Patient can return home with the following Assistance with cooking/housework;Assist for transportation    Functional Status Assessment     Equipment Recommendations  None recommended by OT    Recommendations for Other Services       Precautions / Restrictions Precautions Precautions: Fall;Back Precaution Booklet Issued: Yes (comment) Precaution Comments: reviewed with pt Required Braces or Orthoses:  (no brace) Restrictions Weight Bearing Restrictions: No      Mobility Bed Mobility Overal bed mobility: Modified Independent             General bed mobility comments: completing log roll technique without assist or  cueing    Transfers Overall transfer level: Modified independent Equipment used: Straight cane                      Balance Overall balance assessment: Mild deficits observed, not formally tested                                         ADL either performed or assessed with clinical judgement   ADL Overall ADL's : Modified independent                                       General ADL Comments: pt demonstrates ability to complete ADLs, mobility and transfers with modified independnece using cane after education on compenastory techniques due to back precautions.  has long sponge and [employer] for home.     Vision         Perception     Praxis      Pertinent Vitals/Pain Pain Assessment Pain Assessment: 0-10 Pain Score: 7  Pain Location: back Pain Descriptors / Indicators: Aching, Discomfort, Operative site guarding Pain Intervention(s): Limited activity within patient's tolerance, Monitored during session, Repositioned     Hand Dominance     Extremity/Trunk Assessment Upper Extremity Assessment Upper Extremity Assessment: Overall WFL for tasks assessed   Lower Extremity Assessment Lower Extremity Assessment: Overall WFL for tasks assessed   Cervical / Trunk Assessment Cervical / Trunk Assessment: Back Surgery   Communication Communication Communication: No difficulties   Cognition Arousal/Alertness: Awake/alert  Behavior During Therapy: WFL for tasks assessed/performed Overall Cognitive Status: Within Functional Limits for tasks assessed                                       General Comments  spouse present and supportive    Exercises     Shoulder Instructions      Home Living Family/patient expects to be discharged to:: Private residence Living Arrangements: Spouse/significant other;Children Available Help at Discharge: Family;Available 24 hours/day (for first 2 weeks) Type of Home: House Home  Access: Stairs to enter [employer] of Steps: 1+1 Entrance Stairs-Rails: None Home Layout: One level     Bathroom Shower/Tub: Producer, television/film/video: Standard     Home Equipment: Cane - single point;Shower seat          Prior Functioning/Environment Prior Level of Function : Independent/Modified Independent             Mobility Comments: ambulating using SPC, but limited distance and unable to stand for long periods of time ADLs Comments: reports independent for ADLs, increased time and effort limited by pain        OT Problem List: Decreased activity tolerance;Decreased knowledge of use of DME or AE;Decreased knowledge of precautions;Obesity;Pain      OT Treatment/Interventions:      OT Goals(Current goals can be found in the care plan section) Acute Rehab OT Goals Patient Stated Goal: home OT Goal Formulation: With patient  OT Frequency:      Co-evaluation              AM-PAC OT "6 Clicks" Daily Activity     Outcome Measure Help from another person eating meals?: None Help from another person taking care of personal grooming?: None Help from another person toileting, which includes using toliet, bedpan, or urinal?: None Help from another person bathing (including washing, rinsing, drying)?: None Help from another person to put on and taking off regular upper body clothing?: None Help from another person to put on and taking off regular lower body clothing?: None 6 Click Score: 24   End of Session Equipment Utilized During Treatment: Other (comment) (cane) Nurse Communication: Mobility status;Precautions  Activity Tolerance: Patient tolerated treatment well Patient left: with call bell/phone within reach;Other (comment);with family/visitor present (seated EOB)  OT Visit Diagnosis: Other abnormalities of gait and mobility (R26.89);Pain Pain - part of body:  (back)                Time: 2831-5176 OT Time Calculation (min): 22  min Charges:  OT General Charges $OT Visit: 1 Visit OT Evaluation $OT Eval Low Complexity: 1 Low  Barry Brunner, OT Acute Rehabilitation Services Office [phone]   Chancy Milroy 10/15/2021, 10:01 AM

## 2021-10-15 NOTE — Progress Notes (Signed)
PT Cancellation Note and Discharge  Patient Details Name: Albert Watson MRN: [id number] DOB: 07-May-1990   Cancelled Treatment:    Reason Eval/Treat Not Completed: PT screened, no needs identified, will sign off. Discussed pt case with RN who reports pt is currently mobilizing at an independent level and does not require a formal PT evaluation at this time. PT signing off. If needs change, please reconsult.     Marylynn Pearson 10/15/2021, 9:13 AM  Conni Slipper, PT, DPT Acute Rehabilitation Services Secure Chat Preferred Office: [phone]

## 2021-10-15 NOTE — Plan of Care (Signed)

## 2021-10-15 NOTE — Progress Notes (Signed)
Patient transported to his vehicle by volunteer via wheelchair for discharge home; in no acute distress nor complaints of pain nor discomfort; moves all extremities well; incision on his back with honeycomb dressing and is clean, dry and intact; room was checked for all his belongings and taken along by wife; discharge instructions concerning his medications, incision care, follow up appointment and when to call the doctor as needed were all discussed with patient and his wife by RN and both expressed understanding on the instructions given.

## 2021-11-19 ENCOUNTER — Emergency Department (HOSPITAL_COMMUNITY)
Admission: EM | Admit: 2021-11-19 | Discharge: 2021-11-19 | Disposition: A | Discharge status: Home / Self Care | Payer: Worker's Compensation | Attending: Emergency Medicine | Admitting: Emergency Medicine

## 2021-11-19 ENCOUNTER — Encounter (HOSPITAL_COMMUNITY): Payer: Self-pay

## 2021-11-19 DIAGNOSIS — Z23 Encounter for immunization: Secondary | ICD-10-CM | POA: Diagnosis not present

## 2021-11-19 DIAGNOSIS — W16212A Fall in (into) filled bathtub causing other injury, initial encounter: Secondary | ICD-10-CM | POA: Insufficient documentation

## 2021-11-19 DIAGNOSIS — S0181XA Laceration without foreign body of other part of head, initial encounter: Secondary | ICD-10-CM

## 2021-11-19 DIAGNOSIS — S0993XA Unspecified injury of face, initial encounter: Secondary | ICD-10-CM | POA: Diagnosis present

## 2021-11-19 MED ORDER — TETANUS-DIPHTH-ACELL PERTUSSIS 5-2.5-18.5 LF-MCG/0.5 IM SUSY
0.5000 mL | PREFILLED_SYRINGE | Freq: Once | INTRAMUSCULAR | Status: AC
  Administered 2021-11-19: 0.5 mL via INTRAMUSCULAR
  Filled 2021-11-19: qty 0.5

## 2021-11-19 MED ORDER — LIDOCAINE-EPINEPHRINE (PF) 2 %-1:200000 IJ SOLN
10.0000 mL | Freq: Once | INTRAMUSCULAR | Status: AC
  Administered 2021-11-19: 10 mL via INTRADERMAL
  Filled 2021-11-19: qty 20

## 2021-11-19 NOTE — Discharge Instructions (Signed)
Return for redness drainage or if you get a fever.  The sutures that were used are dissolvable that should dissolve between day 3 and day 5.  If they are still there then you can gently plucked them out with tweezers.  The area can get wet but not fully immersed underwater.  No scrubbing.  If you really want to clean it you can apply a half-and-half hydrogen peroxide solution with water on a Q-tip.  You can apply an ointment a couple times a day this could be as simple as Vaseline but could also be an antibiotic ointment if you wish..  Once it is healed please try to avoid prolonged sun exposure use sunscreen.  Gells that have silicone antigens have been shown to reduce scarring and some research.  Follow-up with a plastic surgeon if you wish.  

## 2021-11-19 NOTE — ED Provider Notes (Signed)
[facility]-EMERGENCY DEPT Provider Note   CSN: [id number] Arrival date & time: 11/19/21  3009     History  Chief Complaint  Patient presents with   Marletta Lor    Albert Watson is a 31 y.o. male.  31 yo M with a chief complaint of a fall.  The patient thinks he lost his balance and fell and struck his face on the side of the bathtub.  Complaining of a laceration to his left frontal region.  Denies any change to his vision denies any dental injury.  Denies any other injury denies confusion denies vomiting denies blood thinner use.  Denies alcohol or illegal drugs.   Fall       Home Medications Prior to Admission medications   Medication Sig Start Date End Date Taking? Authorizing Provider  acetaminophen (TYLENOL) 500 MG tablet Take 1,000 mg by mouth every 6 (six) hours as needed for mild pain.    [provider]  Ascorbic Acid (VITAMIN C PO) Take 1 tablet by mouth daily.    [provider]  bisacodyl (DULCOLAX) 5 MG EC tablet Take 1 tablet (5 mg total) by mouth daily as needed for moderate constipation. 10/01/21   Pokhrel, Rebekah Chesterfield, MD  buPROPion (ZYBAN) 150 MG 12 hr tablet Take 150 mg by mouth 2 (two) times daily.    [provider]  cyclobenzaprine (FLEXERIL) 5 MG tablet Take 1 tablet (5 mg total) by mouth 3 (three) times daily as needed for muscle spasms. 10/15/21   Barnett Abu, MD  DULoxetine (CYMBALTA) 30 MG capsule Take 30 mg by mouth daily.    [provider]  DULoxetine (CYMBALTA) 60 MG capsule Take 60 mg by mouth daily.    [provider]  HYDROmorphone (DILAUDID) 4 MG tablet Take 1 tablet (4 mg total) by mouth every 4 (four) hours as needed for moderate pain (alternate with IV). 10/15/21   Barnett Abu, MD  hydrOXYzine (ATARAX) 50 MG tablet Take 50 mg by mouth every 6 (six) hours as needed for anxiety.    [provider]  lidocaine (LIDODERM) 5 % Place 1 patch onto the skin daily. Remove & Discard patch  within 12 hours or as directed by MD 10/01/21   Joycelyn Das, MD  lisinopril (ZESTRIL) 20 MG tablet Take 20 mg by mouth 2 (two) times daily.    [provider]  methadone (DOLOPHINE) 10 MG tablet Take 20 mg by mouth 2 (two) times daily.    [provider]  methylPREDNISolone (MEDROL DOSEPAK) 4 MG TBPK tablet follow package directions 10/01/21   Pokhrel, Rebekah Chesterfield, MD  OVER THE COUNTER MEDICATION Apply 1 Application topically daily as needed (back pain). CBD cream    [provider]  pantoprazole (PROTONIX) 40 MG tablet Take 1 tablet (40 mg total) by mouth 2 (two) times daily for 14 days. 10/01/21 10/15/21  Pokhrel, Rebekah Chesterfield, MD  pregabalin (LYRICA) 75 MG capsule Take 75 mg by mouth in the morning, at noon, and at bedtime.    [provider]  tamsulosin (FLOMAX) 0.4 MG CAPS capsule Take 0.4 mg by mouth daily.    [provider]      Allergies    Codeine, Hydrocodone-guaifenesin, Septra [bactrim], Sulfa antibiotics, Z-pak [azithromycin], Betadine [povidone-iodine], Penicillins, and Suprax [cefixime]    Review of Systems   Review of Systems  Physical Exam Updated Vital Signs BP (!) 179/142 (BP Location: Right Arm)   Pulse (!) 107   Temp 98.2 F (36.8 C) (Oral)  Resp 18   Ht 5\' 7"  (1.702 m)   Wt 106.6 kg   SpO2 98%   BMI 36.81 kg/m  Physical Exam Vitals and nursing note reviewed.  Constitutional:      Appearance: He is well-developed.  HENT:     Head: Normocephalic.     Comments: Approximately 5.3 cm laceration just medial to the brow on the left.  Eyes:     Pupils: Pupils are equal, round, and reactive to light.  Neck:     Vascular: No JVD.  Cardiovascular:     Rate and Rhythm: Normal rate and regular rhythm.     Heart sounds: No murmur heard.    No friction rub. No gallop.  Pulmonary:     Effort: No respiratory distress.     Breath sounds: No wheezing.  Abdominal:     General: There is no distension.     Tenderness: There is no  abdominal tenderness. There is no guarding or rebound.  Musculoskeletal:        General: Normal range of motion.     Cervical back: Normal range of motion and neck supple.  Skin:    Coloration: Skin is not pale.     Findings: No rash.  Neurological:     Mental Status: He is alert and oriented to person, place, and time.  Psychiatric:        Behavior: Behavior normal.     ED Results / Procedures / Treatments   Labs (all labs ordered are listed, but only abnormal results are displayed) Labs Reviewed - No data to display  EKG None  Radiology No results found.  Procedures . Laceration Repair  Date/Time: 11/19/2021 6:40 AM  Performed by: 11/21/2021, DO Authorized by: Melene Plan, DO   Consent:    Consent obtained:  Verbal   Consent given by:  Patient   Risks, benefits, and alternatives were discussed: yes     Risks discussed:  Infection, pain, poor cosmetic result and poor wound healing   Alternatives discussed:  No treatment and delayed treatment Universal protocol:    Patient identity confirmed:  Verbally with patient Anesthesia:    Anesthesia method:  Local infiltration   Local anesthetic:  Lidocaine 2% WITH epi Laceration details:    Location:  Face   Face location:  Forehead   Length (cm):  5.2 Exploration:    Hemostasis achieved with:  Epinephrine and direct pressure   Wound exploration: entire depth of wound visualized     Wound extent: no nerve damage noted   Treatment:    Area cleansed with:  Saline   Amount of cleaning:  Extensive   Irrigation solution:  Sterile saline   Irrigation volume:  40   Irrigation method:  Syringe   Visualized foreign bodies/material removed: no     Debridement:  None   Undermining:  None   Scar revision: no   Skin repair:    Repair method:  Sutures   Suture size:  5-0   Suture material:  Fast-absorbing gut   Suture technique:  Simple interrupted   Number of sutures:  5 Approximation:    Approximation:  Close Repair  type:    Repair type:  Simple Post-procedure details:    Dressing:  Antibiotic ointment and adhesive bandage   Procedure completion:  Tolerated well, no immediate complications     Medications Ordered in ED Medications  lidocaine-EPINEPHrine (XYLOCAINE W/EPI) 2 %-1:200000 (PF) injection 10 mL (10 mLs Intradermal Given by Other 11/19/21 0600)  Tdap (BOOSTRIX) injection 0.5 mL (0.5 mLs Intramuscular Given 11/19/21 0524)    ED Course/ Medical Decision Making/ A&P                           Medical Decision Making Risk Prescription drug management.   31 yo M with a chief complaints of a fall.  Nonsyncopal by history.  Struck his head.  Cleared by [place] head CT rules.  Will suture at bedside.  6:41 AM:  I have discussed the diagnosis/risks/treatment options with the patient.  Evaluation and diagnostic testing in the emergency department does not suggest an emergent condition requiring admission or immediate intervention beyond what has been performed at this time.  They will follow up with  PCP. We also discussed returning to the ED immediately if new or worsening sx occur. We discussed the sx which are most concerning (e.g., sudden worsening pain, fever, inability to tolerate by mouth) that necessitate immediate return. Medications administered to the patient during their visit and any new prescriptions provided to the patient are listed below.  Medications given during this visit Medications  lidocaine-EPINEPHrine (XYLOCAINE W/EPI) 2 %-1:200000 (PF) injection 10 mL (10 mLs Intradermal Given by Other 11/19/21 0600)  Tdap (BOOSTRIX) injection 0.5 mL (0.5 mLs Intramuscular Given 11/19/21 0524)     The patient appears reasonably screen and/or stabilized for discharge and I doubt any other medical condition or other [facility] requiring further screening, evaluation, or treatment in the ED at this time prior to discharge.          Final Clinical Impression(s) / ED Diagnoses Final diagnoses:   Facial laceration, initial encounter    Rx / DC Orders ED Discharge Orders     None         Melene Plan, DO 11/19/21 [phone]

## 2021-11-19 NOTE — ED Triage Notes (Signed)
Patient arrived with a laceration to his left eyebrow. States he fell and hit his face against the side of the bathtub. Bleeding controlled. No LOC

## 2022-05-19 ENCOUNTER — Institutional Professional Consult (permissible substitution): Payer: 59 | Admitting: Pulmonary Disease

## 2022-06-06 ENCOUNTER — Encounter: Payer: Self-pay | Admitting: Pulmonary Disease

## 2022-06-06 ENCOUNTER — Ambulatory Visit (INDEPENDENT_AMBULATORY_CARE_PROVIDER_SITE_OTHER): Payer: 59 | Admitting: Pulmonary Disease

## 2022-06-06 VITALS — BP 110/68 | HR 100 | Temp 98.2°F | Ht 66.0 in | Wt 246.2 lb

## 2022-06-06 DIAGNOSIS — J849 Interstitial pulmonary disease, unspecified: Secondary | ICD-10-CM | POA: Diagnosis not present

## 2022-06-06 LAB — CBC WITH DIFFERENTIAL/PLATELET
Basophils Absolute: 0.1 10*3/uL (ref 0.0–0.1)
Basophils Relative: 0.9 % (ref 0.0–3.0)
Eosinophils Absolute: 0.3 10*3/uL (ref 0.0–0.7)
Eosinophils Relative: 4.1 % (ref 0.0–5.0)
HCT: 42 % (ref 39.0–52.0)
Hemoglobin: 14.1 g/dL (ref 13.0–17.0)
Lymphocytes Relative: 42.4 % (ref 12.0–46.0)
Lymphs Abs: 2.7 10*3/uL (ref 0.7–4.0)
MCHC: 33.6 g/dL (ref 30.0–36.0)
MCV: 83.4 fl (ref 78.0–100.0)
Monocytes Absolute: 0.6 10*3/uL (ref 0.1–1.0)
Monocytes Relative: 9.2 % (ref 3.0–12.0)
Neutro Abs: 2.7 10*3/uL (ref 1.4–7.7)
Neutrophils Relative %: 43.4 % (ref 43.0–77.0)
Platelets: 231 10*3/uL (ref 150.0–400.0)
RBC: 5.04 Mil/uL (ref 4.22–5.81)
RDW: 13.3 % (ref 11.5–15.5)
WBC: 6.3 10*3/uL (ref 4.0–10.5)

## 2022-06-06 NOTE — Progress Notes (Signed)
Albert Watson    SB:[id number]    [birth year]  Primary Care Physician:Hammond, Estevan Oaks, FNP  Referring Physician: Shawnie Pons, FNP [place],   [id number]  Chief complaint: Evaluation for ILD  HPI: 32 y.o. who  has a past medical history of Anxiety, Asthma, chronic, Depression, Dyspepsia, Environmental allergies, Familial combined hyperlipidemia, GERD (gastroesophageal reflux disease), Goiter, Hard of hearing, Horner's syndrome, Hypertension, Hypothyroidism, acquired, autoimmune, Obesity (BMI 30-39.9), Status post surgery for complex congenital heart disease, Tetralogy of Fallot, and Thyroiditis, autoimmune.   Additional history includes exercise-induced asthma, hypertension, allergies.  He had TOF repair as a child and pulm involved repair 27 years old at [facility].  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  His wife notes witnessed apnea and snoring.  Pets: Has 4 dogs Occupation: Nurse, learning disability Exposures: No mold, hot tub, Jacuzzi.  No feather pillows or comforters Smoking history: Never smoker Travel history: No significant travel Relevant family history: Father, grandfather, great grandmother had pulmonary fibrosis.  At least 3 or 4 generations of his family with history of pulmonary fibrosis  Outpatient Encounter Medications as of 06/06/2022  Medication Sig   acetaminophen (TYLENOL) 500 MG tablet Take 1,000 mg by mouth every 6 (six) hours as needed for mild pain.   bisacodyl (DULCOLAX) 5 MG EC tablet Take 1 tablet (5 mg total) by mouth daily as needed for moderate constipation.   buPROPion (ZYBAN) 150 MG 12 hr tablet Take 150 mg by mouth 2 (two) times daily.   DULoxetine (CYMBALTA) 30 MG capsule Take 30 mg by mouth daily.   DULoxetine (CYMBALTA) 60 MG capsule Take 60 mg by mouth daily.   HYDROmorphone (DILAUDID) 4 MG tablet Take 1 tablet (4 mg total) by  mouth every 4 (four) hours as needed for moderate pain (alternate with IV).   hydrOXYzine (ATARAX) 50 MG tablet Take 50 mg by mouth every 6 (six) hours as needed for anxiety.   lisinopril (ZESTRIL) 20 MG tablet Take 20 mg by mouth 2 (two) times daily.   meloxicam (MOBIC) 15 MG tablet Take 15 mg by mouth daily.   methadone (DOLOPHINE) 10 MG tablet Take 20 mg by mouth 2 (two) times daily. '10mg'$  AM and '20mg'$  evening   omeprazole (PRILOSEC) 20 MG capsule Take 20 mg by mouth 2 (two) times daily.   OVER THE COUNTER MEDICATION Apply 1 Application topically daily as needed (back pain). CBD cream   pregabalin (LYRICA) 75 MG capsule Take 75 mg by mouth in the morning, at noon, and at bedtime.   tamsulosin (FLOMAX) 0.4 MG CAPS capsule Take 0.4 mg by mouth daily.   [DISCONTINUED] buPROPion (WELLBUTRIN SR) 150 MG 12 hr tablet Take 150 mg by mouth 2 (two) times daily.   Ascorbic Acid (VITAMIN C PO) Take 1 tablet by mouth daily. (Patient not taking: Reported on 06/06/2022)   cyclobenzaprine (FLEXERIL) 5 MG tablet Take 1 tablet (5 mg total) by mouth 3 (three) times daily as needed for muscle spasms. (Patient not taking: Reported on 06/06/2022)   lidocaine (LIDODERM) 5 % Place 1 patch onto the skin daily. Remove & Discard patch within 12 hours or as directed by MD (Patient not taking: Reported on 06/06/2022)   pantoprazole (PROTONIX) 40 MG tablet Take 1 tablet (40 mg total) by mouth 2 (two) times daily for 14 days.   [DISCONTINUED] methylPREDNISolone (MEDROL DOSEPAK) 4  MG TBPK tablet follow package directions   No facility-administered encounter medications on file as of 06/06/2022.    Allergies as of 06/06/2022 - Review Complete 06/06/2022  Allergen Reaction Noted   Codeine Hives 08/23/2010   Hydrocodone-guaifenesin Nausea And Vomiting 09/28/2021   Septra [bactrim] Hives 08/23/2010   Sulfa antibiotics Hives 05/19/2011   Z-pak [azithromycin] Hives 05/19/2011   Betadine [povidone-iodine] Rash 09/28/2021   Penicillins  Rash 09/28/2021   Suprax [cefixime] Rash 09/28/2021    Past Medical History:  Diagnosis Date   Anxiety    Asthma, chronic    Depression    Dyspepsia    Environmental allergies    Familial combined hyperlipidemia    GERD (gastroesophageal reflux disease)    Goiter    Hard of hearing    Horner's syndrome    Hypertension    Hypothyroidism, acquired, autoimmune    Obesity (BMI 30-39.9)    Status post surgery for complex congenital heart disease    Tetralogy of Fallot    Thyroiditis, autoimmune     Past Surgical History:  Procedure Laterality Date   CARDIAC SURGERY     complex congenital heart disease     3 separate surgeries (pulmonary valve replaced)   EXTERNAL EAR SURGERY     KNEE SURGERY     LUMBAR SPINE SURGERY     THORACIC DISCECTOMY Left 10/14/2021   Procedure: Left Thoracic nine-Thoracic ten Discectomy;  Surgeon: Kristeen Miss, MD;  Location: [place];  Service: Neurosurgery;  Laterality: Left;    Family History  Problem Relation Age of Onset   Hyperlipidemia Mother        Increased cholesterol and triglycerides   Diabetes Mother        T2 DM   Obesity Mother    Thyroid disease Father    Hyperlipidemia Father        High cholesterol   Heart disease Maternal Uncle        MI and CABG   Heart disease Maternal Grandmother        CABG   Hypertension Maternal Grandmother        T2 DM   Heart disease Maternal Grandfather        CABG and MIs   Cancer Maternal Grandfather        Skin   Heart disease Paternal Grandfather        Died of MI    Social History   Socioeconomic History   Marital status: Married    Spouse name: Not on file   Number of children: 3   Years of education: Not on file   Highest education level: Not on file  Occupational History   Not on file  Tobacco Use   Smoking status: Never   Smokeless tobacco: Former    Types: Snuff   Tobacco comments:    Stopped smokeless 03/2022  Vaping Use   Vaping Use: Former  Substance and Sexual  Activity   Alcohol use: No   Drug use: No   Sexual activity: Not on file  Other Topics Concern   Not on file  Social History Narrative   Not on file   Social Determinants of Health   Financial Resource Strain: Not on file  Food Insecurity: Not on file  Transportation Needs: Not on file  Physical Activity: Not on file  Stress: Not on file  Social Connections: Not on file  Intimate Partner Violence: Not on file    Review of systems: Review of Systems  Constitutional:  Negative for fever and chills.  HENT: Negative.   Eyes: Negative for blurred vision.  Respiratory: as per HPI  Cardiovascular: Negative for chest pain and palpitations.  Gastrointestinal: Negative for vomiting, diarrhea, blood per rectum. Genitourinary: Negative for dysuria, urgency, frequency and hematuria.  Musculoskeletal: Negative for myalgias, back pain and joint pain.  Skin: Negative for itching and rash.  Neurological: Negative for dizziness, tremors, focal weakness, seizures and loss of consciousness.  Endo/Heme/Allergies: Negative for environmental allergies.  Psychiatric/Behavioral: Negative for depression, suicidal ideas and hallucinations.  All other systems reviewed and are negative.  Physical Exam: Blood pressure 110/68, pulse 100, temperature 98.2 F (36.8 C), temperature source Oral, height '5\' 6"'$  (1.676 m), weight 246 lb 3.2 oz (111.7 kg), SpO2 98 %. Gen:      No acute distress HEENT:  EOMI, sclera anicteric Neck:     No masses; no thyromegaly Lungs:    Clear to auscultation bilaterally; normal respiratory effort CV:         Regular rate and rhythm; no murmurs Abd:      + bowel sounds; soft, non-tender; no palpable masses, no distension Ext:    No edema; adequate peripheral perfusion Skin:      Warm and dry; no rash Neuro: alert and oriented x 3 Psych: normal mood and affect  Data Reviewed: Imaging:  PFTs:  Labs:  Assessment:  Assessment for pulmonary fibrosis Has strong family  history Reportedly had spirometry at his primary care office which was normal.  He will get this for our records Will get a high-res CT.  He is interested in genetic testing and we will refer him to [facility] [address] baseline ANA, rheumatoid factor, CCP  Exercise-induced asthma Not on any inhalers Check CBC with differential, IgE  Suspected sleep apnea Has snoring and witnessed apneas His primary care is planning to order a sleep study.  He will let us know if he would rather have the test done through our office.  Plan/Recommendations: Labs High-res CT Genetic testing  Marshell Garfinkel MD [facility] 06/06/2022, 10:18 AM  CC: Shawnie Pons, FNP

## 2022-06-06 NOTE — Patient Instructions (Addendum)
Will get some labs today.  Check level 1 ILD, CBC differential, IgE and CMP We will refer you to [facility] for genetic testing for familial IPF Order high resolution CT Follow-up in 6 months.

## 2022-06-10 LAB — ANTI-DNA ANTIBODY, DOUBLE-STRANDED: ds DNA Ab: 1 IU/mL

## 2022-06-10 LAB — CYCLIC CITRUL PEPTIDE ANTIBODY, IGG: Cyclic Citrullin Peptide Ab: <16 UNITS

## 2022-06-10 LAB — ANA: Anti Nuclear Antibody (ANA): NEGATIVE

## 2022-06-10 LAB — IGE: IgE (Immunoglobulin E), Serum: 26 kU/L (ref <=114)

## 2022-06-10 LAB — RHEUMATOID FACTOR: Rheumatoid fact SerPl-aCnc: <14 IU/mL (ref <14)

## 2022-07-10 ENCOUNTER — Other Ambulatory Visit: Payer: 59

## 2022-07-22 ENCOUNTER — Telehealth: Payer: Self-pay | Admitting: Genetic Counselor

## 2022-07-24 ENCOUNTER — Inpatient Hospital Stay: Payer: 59

## 2022-07-24 ENCOUNTER — Inpatient Hospital Stay: Payer: 59 | Admitting: Genetic Counselor

## 2022-08-14 ENCOUNTER — Encounter (HOSPITAL_COMMUNITY): Payer: Self-pay

## 2022-08-14 ENCOUNTER — Other Ambulatory Visit: Payer: Self-pay

## 2022-08-14 ENCOUNTER — Emergency Department (HOSPITAL_COMMUNITY)
Admission: EM | Admit: 2022-08-14 | Discharge: 2022-08-14 | Disposition: A | Discharge status: Home / Self Care | Payer: Worker's Compensation | Attending: Emergency Medicine | Admitting: Emergency Medicine

## 2022-08-14 DIAGNOSIS — M545 Low back pain, unspecified: Secondary | ICD-10-CM | POA: Insufficient documentation

## 2022-08-14 DIAGNOSIS — Z79899 Other long term (current) drug therapy: Secondary | ICD-10-CM | POA: Diagnosis not present

## 2022-08-14 DIAGNOSIS — I10 Essential (primary) hypertension: Secondary | ICD-10-CM | POA: Diagnosis not present

## 2022-08-14 MED ORDER — DIAZEPAM 5 MG PO TABS
5.0000 mg | ORAL_TABLET | Freq: Once | ORAL | Status: AC
  Administered 2022-08-14: 5 mg via ORAL
  Filled 2022-08-14: qty 1

## 2022-08-14 MED ORDER — HYDROMORPHONE HCL 2 MG/ML IJ SOLN
2.0000 mg | Freq: Once | INTRAMUSCULAR | Status: AC
  Administered 2022-08-14: 2 mg via INTRAMUSCULAR
  Filled 2022-08-14: qty 1

## 2022-08-14 MED ORDER — PREDNISONE 20 MG PO TABS: 40.0000 mg | ORAL_TABLET | Freq: Every day | ORAL | 0 refills | Status: DC

## 2022-08-14 MED ORDER — METHADONE HCL 10 MG PO TABS
20.0000 mg | ORAL_TABLET | Freq: Once | ORAL | Status: AC
  Administered 2022-08-14: 20 mg via ORAL
  Filled 2022-08-14: qty 2

## 2022-08-14 MED ORDER — PREDNISONE 20 MG PO TABS
60.0000 mg | ORAL_TABLET | Freq: Once | ORAL | Status: AC
  Administered 2022-08-14: 60 mg via ORAL
  Filled 2022-08-14: qty 3

## 2022-08-14 MED ORDER — PREDNISONE 20 MG PO TABS: 40.0000 mg | ORAL_TABLET | Freq: Every day | ORAL | 0 refills | Status: AC

## 2022-08-14 MED ORDER — KETOROLAC TROMETHAMINE 15 MG/ML IJ SOLN
15.0000 mg | Freq: Once | INTRAMUSCULAR | Status: AC
  Administered 2022-08-14: 15 mg via INTRAMUSCULAR
  Filled 2022-08-14: qty 1

## 2022-08-14 NOTE — ED Provider Notes (Signed)
[place] EMERGENCY DEPARTMENT AT [facility] Provider Note   CSN: [id number] Arrival date & time: 08/14/22  1802     History  Chief Complaint  Patient presents with   Back Pain    Albert Watson is a 32 y.o. male.  32 y.o male with a PMH of GERD, HTN, chronic back pain presents to the ED with a chief complaint of lower back pain.  Reports his symptoms started 2 days ago.  Unfortunately, patient's primary care is currently the one prescribing him methadone, states that he has been out of the office for the past week, has not been able to get this medication refilled.  In addition, he is also taking Dilaudid 4 mg prescribed by his neurosurgeon.  He reports he took the Dilaudid this morning around 1 PM, he has not had any improvement in his symptoms.  He now states that he has a methadone prescription at home that his PCP provided, however, this has not been filled.  He does have some urinary complaints, however these have been ongoing and they think is likely due to nerve damage that occurred in the past.  He is here without any fever, no history of IV drug use, no other complaints.  The history is provided by the patient.  Back Pain Associated symptoms: no fever        Home Medications Prior to Admission medications   Medication Sig Start Date End Date Taking? Authorizing Provider  predniSONE (DELTASONE) 20 MG tablet Take 2 tablets (40 mg total) by mouth daily for 5 days. 08/14/22 08/19/22 Yes Mosi Hannold, Leonie Douglas, PA-C  acetaminophen (TYLENOL) 500 MG tablet Take 1,000 mg by mouth every 6 (six) hours as needed for mild pain.    [provider]  Ascorbic Acid (VITAMIN C PO) Take 1 tablet by mouth daily. Patient not taking: Reported on 06/06/2022    [provider]  bisacodyl (DULCOLAX) 5 MG EC tablet Take 1 tablet (5 mg total) by mouth daily as needed for moderate constipation. 10/01/21   Pokhrel, Rebekah Chesterfield, MD  buPROPion (ZYBAN) 150 MG 12 hr tablet Take 150 mg by  mouth 2 (two) times daily.    [provider]  cyclobenzaprine (FLEXERIL) 5 MG tablet Take 1 tablet (5 mg total) by mouth 3 (three) times daily as needed for muscle spasms. Patient not taking: Reported on 06/06/2022 10/15/21   Barnett Abu, MD  DULoxetine (CYMBALTA) 30 MG capsule Take 30 mg by mouth daily.    [provider]  DULoxetine (CYMBALTA) 60 MG capsule Take 60 mg by mouth daily.    [provider]  HYDROmorphone (DILAUDID) 4 MG tablet Take 1 tablet (4 mg total) by mouth every 4 (four) hours as needed for moderate pain (alternate with IV). 10/15/21   Barnett Abu, MD  hydrOXYzine (ATARAX) 50 MG tablet Take 50 mg by mouth every 6 (six) hours as needed for anxiety.    [provider]  lidocaine (LIDODERM) 5 % Place 1 patch onto the skin daily. Remove & Discard patch within 12 hours or as directed by MD Patient not taking: Reported on 06/06/2022 10/01/21   Pokhrel, Rebekah Chesterfield, MD  lisinopril (ZESTRIL) 20 MG tablet Take 20 mg by mouth 2 (two) times daily.    [provider]  meloxicam (MOBIC) 15 MG tablet Take 15 mg by mouth daily. 04/23/22   [provider]  methadone (DOLOPHINE) 10 MG tablet Take 20 mg by mouth 2 (two) times daily. 10mg  AM and 20mg   evening    [provider]  omeprazole (PRILOSEC) 20 MG capsule Take 20 mg by mouth 2 (two) times daily. 06/01/22   [provider]  OVER THE COUNTER MEDICATION Apply 1 Application topically daily as needed (back pain). CBD cream    [provider]  pantoprazole (PROTONIX) 40 MG tablet Take 1 tablet (40 mg total) by mouth 2 (two) times daily for 14 days. 10/01/21 10/15/21  Pokhrel, Rebekah Chesterfield, MD  pregabalin (LYRICA) 75 MG capsule Take 75 mg by mouth in the morning, at noon, and at bedtime.    [provider]  tamsulosin (FLOMAX) 0.4 MG CAPS capsule Take 0.4 mg by mouth daily.    [provider]      Allergies    Codeine, Hydrocodone-guaifenesin, Septra [bactrim],  Sulfa antibiotics, Z-pak [azithromycin], Betadine [povidone-iodine], Penicillins, and Suprax [cefixime]    Review of Systems   Review of Systems  Constitutional:  Negative for fever.  Gastrointestinal:  Negative for constipation.  Genitourinary:  Negative for difficulty urinating.  Musculoskeletal:  Positive for back pain.  All other systems reviewed and are negative.   Physical Exam Updated Vital Signs BP (!) 141/83 (BP Location: Left Arm)   Pulse (!) 117   Temp 98 F (36.7 C) (Oral)   Resp 19   Ht 5\' 6"  (1.676 m)   Wt 111.1 kg   SpO2 100%   BMI 39.54 kg/m  Physical Exam Vitals and nursing note reviewed.  Constitutional:      Appearance: Normal appearance.  HENT:     Head: Normocephalic.     Mouth/Throat:     Mouth: Mucous membranes are moist.  Cardiovascular:     Rate and Rhythm: Normal rate.  Pulmonary:     Effort: Pulmonary effort is normal.  Abdominal:     General: Abdomen is flat.  Musculoskeletal:     Cervical back: Normal range of motion and neck supple.     Lumbar back: Tenderness present.     Comments: RLE- KF,KE 5/5 strength LLE- HF, HE 5/5 strength Normal gait. No pronator drift. No leg drop.  CN I, II and VIII not tested. CN II-XII grossly intact bilaterally.      Skin:    General: Skin is warm and dry.  Neurological:     Mental Status: He is alert and oriented to person, place, and time.     ED Results / Procedures / Treatments   Labs (all labs ordered are listed, but only abnormal results are displayed) Labs Reviewed - No data to display  EKG None  Radiology No results found.  Procedures Procedures    Medications Ordered in ED Medications  methadone (DOLOPHINE) tablet 20 mg (20 mg Oral Given 08/14/22 1842)  HYDROmorphone (DILAUDID) injection 2 mg (2 mg Intramuscular Given 08/14/22 2001)  ketorolac (TORADOL) 15 MG/ML injection 15 mg (15 mg Intramuscular Given 08/14/22 2001)  diazepam (VALIUM) tablet 5 mg (5 mg Oral Given 08/14/22  2053)  predniSONE (DELTASONE) tablet 60 mg (60 mg Oral Given 08/14/22 2053)    ED Course/ Medical Decision Making/ A&P                             Medical Decision Making Risk Prescription drug management.    Patient presents to the ED with going back pain that he has had since an injury 2 years ago.  He is currently on a pain regimen of methadone by his PCP, however reports he  did run out of this prescription.  In addition, he does also take 4 mg of Dilaudid last taken at 1 PM today, he reports his pain is not getting any better.  His exam is benign, he has had no further trauma, no red flags such as fever, numbness, urinary or bowel complaints at this time.  Hemodynamically stable on arrival.  Given his home dose of methadone with 20 mg.  Also given IM Dilaudid, IM Toradol to help with pain control.  Patient and I discussed how I would be unable to completely resolve his pain at this time.  He is on a pain regimen, this seems like chronic pain, now he reports he does have the methadone prescription at home, we discussed filling up this prescription and then.  He does not have any new finding in order to obtain further workup at this time.  I do feel that is reasonable to send him for outpatient follow-up.  8:55 PM discussed management with patient along with significant other at the bedside.  I do feel that this is patient's ongoing pain, however he now tells me that he does not feel like his pain medication is working at home as he is taking the medication more frequently, I discussed with him that I am unable to titrate up this medication at this time.  I can provide him with some steroids while here, along with some muscle relaxers to help with his symptoms however, he will need to follow-up with his physician.  Now he reports that he took his methadone dose yesterday, however I was under the impression the patient was out of methadone.  He was given multiple rounds of medication here with  Dilaudid, Toradol, Valium, prednisone.  He does not have any history of diabetes, we discussed a short course of steroids to help with pain control.  I do feel that patient symptoms are acute on chronic pain.  Wife reports he was told he might need a myelogram, however we are unable to order this testing at this time.  9:32 PM patient does report relief in his pain after Valium along with some prednisone, will go home on a short course of steroids, denies any history of diabetes.  Patient is hemodynamically stable for discharge.   Portions of this note were generated with Scientist, clinical (histocompatibility and immunogenetics). Dictation errors may occur despite best attempts at proofreading.   Final Clinical Impression(s) / ED Diagnoses Final diagnoses:  Acute midline low back pain without sciatica  Morbid obesity (HCC)    Rx / DC Orders ED Discharge Orders          Ordered    predniSONE (DELTASONE) 20 MG tablet  Daily        08/14/22 2131              Claude Manges, PA-C 08/14/22 2132    Gwyneth Sprout, MD 08/14/22 2329

## 2022-08-14 NOTE — Discharge Instructions (Addendum)
You were given a prescription for steroids, please take 2 tablets daily for the next 5 days.  Please be aware steroids can make you flushed, cause insomnia, cause appetite changes.   Please follow-up with your primary care physician as needed.

## 2022-08-14 NOTE — ED Triage Notes (Signed)
Patient got hurt at work 2 years ago. Had a partial laminectomy and a disc removed last year. His doctor was out of the office so patient went a week without taking methadone and said his lower/mid back pain is uncontrolled.

## 2022-09-02 ENCOUNTER — Other Ambulatory Visit: Payer: 59

## 2022-09-02 ENCOUNTER — Encounter: Payer: 59 | Admitting: Genetic Counselor

## 2022-09-17 ENCOUNTER — Other Ambulatory Visit (HOSPITAL_COMMUNITY): Payer: Self-pay | Admitting: Neurological Surgery

## 2022-09-17 DIAGNOSIS — G894 Chronic pain syndrome: Secondary | ICD-10-CM

## 2022-09-17 DIAGNOSIS — M4714 Other spondylosis with myelopathy, thoracic region: Secondary | ICD-10-CM

## 2022-10-15 ENCOUNTER — Inpatient Hospital Stay (HOSPITAL_COMMUNITY): Admission: RE | Admit: 2022-10-15 | Payer: 59 | Source: Ambulatory Visit

## 2022-10-15 ENCOUNTER — Ambulatory Visit (HOSPITAL_COMMUNITY): Payer: Worker's Compensation

## 2022-10-28 ENCOUNTER — Ambulatory Visit (HOSPITAL_COMMUNITY): Payer: 59

## 2022-10-28 ENCOUNTER — Other Ambulatory Visit (HOSPITAL_COMMUNITY): Payer: Self-pay

## 2022-10-29 ENCOUNTER — Other Ambulatory Visit: Payer: Self-pay

## 2022-10-29 ENCOUNTER — Ambulatory Visit (HOSPITAL_COMMUNITY)
Admission: RE | Admit: 2022-10-29 | Discharge: 2022-10-29 | Disposition: A | Discharge status: Home / Self Care | Payer: Worker's Compensation | Source: Ambulatory Visit | Attending: Neurological Surgery | Admitting: Neurological Surgery

## 2022-10-29 DIAGNOSIS — M4714 Other spondylosis with myelopathy, thoracic region: Secondary | ICD-10-CM | POA: Insufficient documentation

## 2022-10-29 DIAGNOSIS — G894 Chronic pain syndrome: Secondary | ICD-10-CM

## 2022-10-29 DIAGNOSIS — S22088S Other fracture of T11-T12 vertebra, sequela: Secondary | ICD-10-CM | POA: Diagnosis not present

## 2022-10-29 DIAGNOSIS — X58XXXS Exposure to other specified factors, sequela: Secondary | ICD-10-CM | POA: Insufficient documentation

## 2022-10-29 DIAGNOSIS — S22068S Other fracture of T7-T8 thoracic vertebra, sequela: Secondary | ICD-10-CM | POA: Diagnosis not present

## 2022-10-29 DIAGNOSIS — M2578 Osteophyte, vertebrae: Secondary | ICD-10-CM | POA: Diagnosis not present

## 2022-10-29 DIAGNOSIS — M5136 Other intervertebral disc degeneration, lumbar region: Secondary | ICD-10-CM | POA: Diagnosis not present

## 2022-10-29 DIAGNOSIS — M4312 Spondylolisthesis, cervical region: Secondary | ICD-10-CM | POA: Diagnosis not present

## 2022-10-29 MED ORDER — DIAZEPAM 5 MG PO TABS
ORAL_TABLET | ORAL | Status: AC
  Filled 2022-10-29: qty 2

## 2022-10-29 MED ORDER — ONDANSETRON HCL 4 MG/2ML IJ SOLN: 4.0000 mg | Freq: Four times a day (QID) | INTRAMUSCULAR | Status: DC | PRN

## 2022-10-29 MED ORDER — IOHEXOL 300 MG/ML  SOLN
10.0000 mL | Freq: Once | INTRAMUSCULAR | Status: AC | PRN
  Administered 2022-10-29: 8 mL via INTRATHECAL

## 2022-10-29 MED ORDER — DEXAMETHASONE 4 MG PO TABS
4.0000 mg | ORAL_TABLET | Freq: Once | ORAL | Status: AC
  Administered 2022-10-29: 4 mg via ORAL
  Filled 2022-10-29 (×3): qty 1

## 2022-10-29 MED ORDER — LIDOCAINE HCL (PF) 1 % IJ SOLN
5.0000 mL | Freq: Once | INTRAMUSCULAR | Status: AC
  Administered 2022-10-29: 3 mL via INTRADERMAL

## 2022-10-29 MED ORDER — DIAZEPAM 5 MG PO TABS
10.0000 mg | ORAL_TABLET | Freq: Once | ORAL | Status: AC
  Administered 2022-10-29: 10 mg via ORAL
  Filled 2022-10-29: qty 2

## 2022-10-29 MED ORDER — HYDROMORPHONE HCL 2 MG PO TABS
2.0000 mg | ORAL_TABLET | ORAL | Status: DC | PRN
  Administered 2022-10-29: 2 mg via ORAL
  Filled 2022-10-29: qty 1

## 2022-10-29 NOTE — Procedures (Signed)
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  He has continued issues with thoracic back pain in addition to lumbar degenerative changes which she has had in the past.  He has a severe claustrophobia which has required general anesthesia for MRIs in the past.  He has been advised regarding a total myelogram as an alternative way to look at his spine.  He a total myelogram is now being performed as he has known lumbar spondylosis and he has had increasing evidence of some cervical radicular symptoms. 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 Post op Dx: Same Procedure: Total myelogram Surgeon: Katelyne Galster Puncture level: L2-3 Fluid color: Clear colorless Injection: Iohexol 300, 8 mL Findings: Diffuse lumbar spondylosis at L3-4 and L4-5 with moderate disc bulging.  Midthoracic disc bulge noted from previous surgical site.  No overt spinal cord or canal compression.  Cervical spondylosis without nerve root compromise.  Will plan for further evaluation with total CT scanning from occiput to sacrum.
